# Patient Record
Sex: Male | Born: 1975 | Hispanic: No | Marital: Married | Smoking: Never smoker
Health system: Southern US, Community
[De-identification: ages and names within clinical notes are randomized; demographics above are authoritative.]

## PROBLEM LIST (undated history)

## (undated) DIAGNOSIS — F419 Anxiety disorder, unspecified: Secondary | ICD-10-CM

## (undated) DIAGNOSIS — Z86010 Personal history of colonic polyps: Secondary | ICD-10-CM

## (undated) DIAGNOSIS — L649 Androgenic alopecia, unspecified: Secondary | ICD-10-CM

## (undated) HISTORY — DX: Androgenic alopecia, unspecified: L64.9

## (undated) HISTORY — DX: Personal history of colonic polyps: Z86.010

## (undated) HISTORY — DX: Anxiety disorder, unspecified: F41.9

## (undated) HISTORY — PX: COLONOSCOPY: SHX174

---

## 2009-08-31 HISTORY — PX: COLONOSCOPY: SHX174

## 2010-03-25 ENCOUNTER — Ambulatory Visit: Payer: Self-pay | Admitting: Gastroenterology

## 2010-03-25 DIAGNOSIS — R197 Diarrhea, unspecified: Secondary | ICD-10-CM | POA: Insufficient documentation

## 2010-03-26 LAB — CONVERTED CEMR LAB
Basophils Relative: 0.7 % (ref 0.0–3.0)
CO2: 31 meq/L (ref 19–32)
Calcium: 9.3 mg/dL (ref 8.4–10.5)
Chloride: 102 meq/L (ref 96–112)
Creatinine, Ser: 0.9 mg/dL (ref 0.4–1.5)
Eosinophils Absolute: 0.1 10*3/uL (ref 0.0–0.7)
Eosinophils Relative: 1.4 % (ref 0.0–5.0)
GFR calc non Af Amer: 98.98 mL/min (ref >60)
Glucose, Bld: 99 mg/dL (ref 70–99)
Hemoglobin: 14.6 g/dL (ref 13.0–17.0)
MCHC: 34.2 g/dL (ref 30.0–36.0)
MCV: 85.5 fL (ref 78.0–100.0)
Monocytes Absolute: 0.7 10*3/uL (ref 0.1–1.0)
Neutro Abs: 6 10*3/uL (ref 1.4–7.7)
RBC: 5 M/uL (ref 4.22–5.81)
Sodium: 138 meq/L (ref 135–145)
Total Bilirubin: 0.6 mg/dL (ref 0.3–1.2)
Total Protein: 7.2 g/dL (ref 6.0–8.3)

## 2010-03-27 ENCOUNTER — Encounter (INDEPENDENT_AMBULATORY_CARE_PROVIDER_SITE_OTHER): Payer: Self-pay | Admitting: *Deleted

## 2010-03-27 LAB — CONVERTED CEMR LAB: Tissue Transglutaminase Ab, IgA: 10.9 units (ref <20)

## 2010-03-28 ENCOUNTER — Encounter (INDEPENDENT_AMBULATORY_CARE_PROVIDER_SITE_OTHER): Payer: Self-pay | Admitting: *Deleted

## 2010-03-31 ENCOUNTER — Ambulatory Visit: Payer: Self-pay | Admitting: Gastroenterology

## 2010-04-07 ENCOUNTER — Ambulatory Visit: Payer: Self-pay | Admitting: Gastroenterology

## 2010-04-10 ENCOUNTER — Encounter: Payer: Self-pay | Admitting: Gastroenterology

## 2010-05-15 ENCOUNTER — Ambulatory Visit: Payer: Self-pay | Admitting: Internal Medicine

## 2010-05-15 DIAGNOSIS — B360 Pityriasis versicolor: Secondary | ICD-10-CM | POA: Insufficient documentation

## 2010-05-15 DIAGNOSIS — B356 Tinea cruris: Secondary | ICD-10-CM | POA: Insufficient documentation

## 2010-05-15 LAB — CONVERTED CEMR LAB
Direct LDL: 167.4 mg/dL
HDL: 45.2 mg/dL (ref >39.00)
PSA: 0.97 ng/mL (ref 0.10–4.00)

## 2010-09-30 NOTE — Letter (Signed)
Summary: Lipid Letter  [facility]  [address] [place], [place] [address]   Phone: [phone]  Fax: [phone]    05/15/2010  [name] [name] [address] [place], [place]  [address]  Dear [name]:  We have carefully reviewed your last lipid profile from  and the results are noted below with a summary of recommendations for lipid management.    Cholesterol:       221     Goal: <200   HDL "good" Cholesterol:   40.34     Goal: >40   LDL "bad" Cholesterol:   167     Goal: <130   Triglycerides:       110.0     Goal: <150        TLC Diet (Therapeutic Lifestyle Change): Saturated Fats & Transfatty acids should be kept < 7% of total calories ***Reduce Saturated Fats Polyunstaurated Fat can be up to 10% of total calories Monounsaturated Fat Fat can be up to 20% of total calories Total Fat should be no greater than 25-35% of total calories Carbohydrates should be 50-60% of total calories Protein should be approximately 15% of total calories Fiber should be at least 20-30 grams a day ***Increased fiber may help lower LDL Total Cholesterol should be < 200mg /day Consider adding plant stanol/sterols to diet (example: Benacol spread) ***A higher intake of unsaturated fat may reduce Triglycerides and Increase HDL    Adjunctive Measures (may lower LIPIDS and reduce risk of Heart Attack) include: Aerobic Exercise (20-30 minutes 3-4 times a week) Limit Alcohol Consumption Weight Reduction Aspirin 75-81 mg a day by mouth (if not allergic or contraindicated) Dietary Fiber 20-30 grams a day by mouth     Current Medications: 1)    Propecia 1 Mg Tabs (Finasteride) .... One tablet by mouth once daily 2)    Advil 200 Mg Tabs (Ibuprofen) .... As needed 3)    Excedrin Extra Strength 250-250-65 Mg Tabs (Aspirin-acetaminophen-caffeine) .... As needed 4)    Ketoconazole 200 Mg Tabs (Ketoconazole) .... One by mouth once daily for 3 days 5)    Ketoconazole 2 % Crea (Ketoconazole)  .... Apply to aa two times a day  If you have any questions, please call. We appreciate being able to work with you.   Sincerely,    [facility] Etta Grandchild MD

## 2010-09-30 NOTE — Assessment & Plan Note (Signed)
History of Present Illness Visit Type: new patient  Primary GI MD: Rob Bunting MD Primary Provider: Maricela Bo, MD  Requesting Provider: na Chief Complaint: GERD History of Present Illness:     -year-old man  who has nausea with most meals, also a brisk gastro-colic reflex.  All foods.  His brother was just diagnosed with Celiac Sprue.   will have loose stools at night sometimes. Dairy not an issue.  He tends to have loose stools, for 2-3 months.  A lot of bloating, dyspepsia.  Other brother has crohn's disease.  Pt never sees blood  in stools.  Has gained 20 pounds in past year.           Current Medications (verified): 1)  Propecia 1 Mg Tabs (Finasteride) .... One Tablet By Mouth Once Daily 2)  Advil 200 Mg Tabs (Ibuprofen) .... As Needed 3)  Excedrin Extra Strength [id number] Mg Tabs (Aspirin-Acetaminophen-Caffeine) .... As Needed  Allergies (verified): No Known Drug Allergies  Past History:  Past Medical History: none  Past Surgical History: none  Family History: mother had colon cancer in her 8s, 54s Brother with celiac sprue Other Brother with Crohn's disease  Social History: he is married, he has 2 children, he works as a Nurse, adult, he does not smoke cigarettes or drink much alcohol. He drinks 4 caffeinated beverages a day  Review of Systems       Pertinent positive and negative review of systems were noted in the above HPI and GI specific review of systems.  All other review of systems was otherwise negative.   Vital Signs:  Patient profile:   35 year old male Height:      70 inches Weight:      218 pounds BMI:     31.39 BSA:     2.17 Pulse rate:   76 / minute Pulse rhythm:   regular BP sitting:   132 / 84  (left arm) Cuff size:   regular  Vitals Entered By: Ok Anis CMA (March 25, 2010 3:02 PM)  Physical Exam  Additional Exam:  Constitutional: generally well appearing Psychiatric: alert and oriented times  3 Eyes: extraocular movements intact Mouth: oropharynx moist, no lesions Neck: supple, no lymphadenopathy Cardiovascular: heart regular rate and rythm Lungs: CTA bilaterally Abdomen: soft, non-tender, non-distended, no obvious ascites, no peritoneal signs, normal bowel sounds Extremities: no lower extremity edema bilaterally Skin: no lesions on visible extremities    Impression & Recommendations:  Problem # 1:  loose stools, family history of colon cancer, family history of celiac sprue he has had a change in his bowel habits recently, loosening of his stools. 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 Perhaps inflammatory bowel disease. His mother had colon cancer and so that is also the differential list. He will get a basic set of labs including CBC, complete metabolic profile, celiac sprue testing. He will at least need a colonoscopy and if the above tests are suggestive for celiac sprue that he will also need an upper endoscopy to biopsy his duodenum.  Other Orders: TLB-CBC Platelet - w/Differential (85025-CBCD) TLB-Sedimentation Rate (ESR) (85652-ESR) TLB-IgA (Immunoglobulin A) (82784-IGA) T-Sprue Panel (Celiac Disease Aby Eval) (83516x3/[address]) TLB-CMP (Comprehensive Metabolic Pnl) (80053-COMP)  Patient Instructions: 1)  You will get lab test(s) done today (cbc, esr, total IgA leve, celiac sprue panel, cmet). 2)  You will be scheduled to have a colonoscopy for loose stools, family history of colon cancer. 3)  The medication list was reviewed and reconciled.  All changed / newly prescribed medications were explained.  A complete medication list was provided to the patient / caregiver.

## 2010-09-30 NOTE — Letter (Signed)
Summary: Previsit letter  [facility]  [address] [place], [place] [address]   Phone: [phone]  Fax: [phone]       03/27/2010 MRN: [id number]  [name] [name] [address] [place], [place]  [address]  Dear Mr. Gerre Pebbles,  Welcome to the Gastroenterology Division at [facility].    You are scheduled to see a nurse for your pre-procedure visit on 03/31/10 at 4 pm on the 3rd floor at [facility], [address]  We ask that you try to arrive at our office 15 minutes prior to your appointment time to allow for check-in.  Your nurse visit will consist of discussing your medical and surgical history, your immediate family medical history, and your medications.    Please bring a complete list of all your medications or, if you prefer, bring the medication bottles and we will list them.  We will need to be aware of both prescribed and over the counter drugs.  We will need to know exact dosage information as well.  If you are on blood thinners (Coumadin, Plavix, Aggrenox, Ticlid, etc.) please call our office today/prior to your appointment, as we need to consult with your physician about holding your medication.   Please be prepared to read and sign documents such as consent forms, a financial agreement, and acknowledgement forms.  If necessary, and with your consent, a friend or relative is welcome to sit-in on the nurse visit with you.  Please bring your insurance card so that we may make a copy of it.  If your insurance requires a referral to see a specialist, please bring your referral form from your primary care physician.  No co-pay is required for this nurse visit.     If you cannot keep your appointment, please call [phone] to cancel or reschedule prior to your appointment date.  This allows [place] the opportunity to schedule an appointment for another patient in need of care.    Thank you for choosing [facility] for your medical needs.   We appreciate the opportunity to care for you.  Please visit [place] at our website  to learn more about our practice.                     Sincerely.                                                                                                                   The Gastroenterology Division  Appended Document: Previsit letter mailed

## 2010-09-30 NOTE — Letter (Signed)
Summary: [facility] Instructions  [facility]  [address] [place], [place] [address]   Phone: [phone]  Fax: [phone]       [name] [name]    November 29, 1975    MRN: [id number]        Procedure Day Dorna Bloom:  Duanne Limerick  04/07/10     Arrival Time:  2:30pm     Procedure Time: 3:30pm     Location of Procedure:                    _X _   [facility]oscopy Center (4th Floor)                       PREPARATION FOR COLONOSCOPY WITH MOVIPREP   Starting 5 days prior to your procedure  [facility] 08/03  do not eat nuts, seeds, popcorn, corn, beans, peas,  salads, or any raw vegetables.  Do not take any fiber supplements (e.g. Metamucil, Citrucel, and Benefiber).  THE DAY BEFORE YOUR PROCEDURE         DATE:  SUNDAY  08/07  1.  Drink clear liquids the entire day-NO SOLID FOOD  2.  Do not drink anything colored red or purple.  Avoid juices with pulp.  No orange juice.  3.  Drink at least 64 oz. (8 glasses) of fluid/clear liquids during the day to prevent dehydration and help the prep work efficiently.  CLEAR LIQUIDS INCLUDE: Water Jello Ice Popsicles Tea (sugar ok, no milk/cream) Powdered fruit flavored drinks Coffee (sugar ok, no milk/cream) Gatorade Juice: apple, white grape, white cranberry  Lemonade Clear bullion, consomm, broth Carbonated beverages (any kind) Strained chicken noodle soup Hard Candy                             4.  In the morning, mix first dose of MoviPrep solution:    Empty 1 Pouch A and 1 Pouch B into the disposable container    Add lukewarm drinking water to the top line of the container. Mix to dissolve    Refrigerate (mixed solution should be used within 24 hrs)  5.  Begin drinking the prep at 5:00 p.m. The MoviPrep container is divided by 4 marks.   Every 15 minutes drink the solution down to the next mark (approximately 8 oz) until the full liter is complete.   6.  Follow completed prep with 16 oz of clear liquid of your choice (Nothing  red or purple).  Continue to drink clear liquids until bedtime.  7.  Before going to bed, mix second dose of MoviPrep solution:    Empty 1 Pouch A and 1 Pouch B into the disposable container    Add lukewarm drinking water to the top line of the container. Mix to dissolve    Refrigerate  THE DAY OF YOUR PROCEDURE      DATE:  MONDAY  08/08  Beginning at  10:30 a.m (5 hours before procedure):         1. Every 15 minutes, drink the solution down to the next mark (approx 8 oz) until the full liter is complete.  2. Follow completed prep with 16 oz. of clear liquid of your choice.    3. You may drink clear liquids until  1:30pm  (2 HOURS BEFORE PROCEDURE).   MEDICATION INSTRUCTIONS  Unless otherwise instructed, you should take regular prescription medications with a small sip of water   as  early as possible the morning of your procedure.         OTHER INSTRUCTIONS  You will need a responsible adult at least 37 years of age to accompany you and drive you home.   This person must remain in the waiting room during your procedure.  Wear loose fitting clothing that is easily removed.  Leave jewelry and other valuables at home.  However, you may wish to bring a book to read or  an iPod/MP3 player to listen to music as you wait for your procedure to start.  Remove all body piercing jewelry and leave at home.  Total time from sign-in until discharge is approximately 2-3 hours.  You should go home directly after your procedure and rest.  You can resume normal activities the  day after your procedure.  The day of your procedure you should not:   Drive   Make legal decisions   Operate machinery   Drink alcohol   Return to work  You will receive specific instructions about eating, activities and medications before you leave.    The above instructions have been reviewed and explained to me by   Ezra Sites RN  March 31, 2010 4:05 PM     I fully understand and can  verbalize these instructions _____________________________ Date _________

## 2010-09-30 NOTE — Miscellaneous (Signed)
Summary: [facility]  Clinical Lists Changes  Medications: Added new medication of MOVIPREP 100 GM  SOLR (PEG-KCL-NACL-NASULF-NA ASC-C) As per prep instructions. - Signed Rx of MOVIPREP 100 GM  SOLR (PEG-KCL-NACL-NASULF-NA ASC-C) As per prep instructions.;  #1 x 0;  Signed;  Entered by: Ezra Sites RN;  Authorized by: Rachael Fee MD;  Method used: Electronically to [facility], [place], [place], [place]  [address], Ph: [phone], Fax: [phone] Observations: Added new observation of NKA: T (03/31/2010 15:46)    Prescriptions: MOVIPREP 100 GM  SOLR (PEG-KCL-NACL-NASULF-NA ASC-C) As per prep instructions.  #1 x 0   Entered by:   Ezra Sites RN   Authorized by:   Rachael Fee MD   Signed by:   Ezra Sites RN on 03/31/2010   Method used:   Electronically to        [facility] (retail)       [address]       [place], [place]  [address]       Ph: [phone]       Fax: [phone]   RxID:   [id number]

## 2010-09-30 NOTE — Procedures (Signed)
Summary: Colonoscopy  Patient: [name] [name] Note: All result statuses are Final unless otherwise noted.  Tests: (1) Colonoscopy (COL)   COL Colonoscopy           DONE     [facility]     [address]     [place], [place]  [address]           COLONOSCOPY PROCEDURE REPORT           PATIENT:  [name] [name], [name]  MR#:  [id number]     BIRTHDATE:  [birth year], 33 yrs. old  GENDER:  male     ENDOSCOPIST:  Rachael Fee, MD     PROCEDURE DATE:  04/07/2010     PROCEDURE:  Colonoscopy with biopsy and snare polypectomy     ASA CLASS:  Class I     INDICATIONS:  dyspepsia, loose stools; family history of Crohn's,     Celiac Sprue and colon cancer (mother in late 66s)     MEDICATIONS:   Fentanyl 50 mcg IV, Versed 6 mg IV           DESCRIPTION OF PROCEDURE:   After the risks benefits and     alternatives of the procedure were thoroughly explained, informed     consent was obtained.  Digital rectal exam was performed and     revealed no rectal masses.   The LB [id number] endoscope     was introduced through the anus and advanced to the terminal ileum     which was intubated for a short distance, without limitations.     The quality of the prep was good, using MoviPrep.  The instrument     was then slowly withdrawn as the colon was fully examined.     <<PROCEDUREIMAGES>>           FINDINGS:  The terminal ileum appeared normal (see image2).  A     sessile polyp was found in the mid transverse colon. This was 3mm     across, removed with cold snare and sent to pathology (jar 2) (see     image3).  This was otherwise a normal examination of the colon.     Random biopsies were taken from colon and sent to pathology (jar     1) (see image1 and image5).   Retroflexed views in the rectum     revealed no abnormalities.    The scope was then withdrawn from     the patient and the procedure completed.           COMPLICATIONS:  None     ENDOSCOPIC IMPRESSION:     1) Normal terminal  ileum; no sign of Crohn's disease     2) Sessile polyp in the mid transverse colon, removed and sent     to pathology     3) Otherwise normal examination; colon was randomly biopsied to     check for microsopic colitis           RECOMMENDATIONS:     1) Given your significant family history of colon cancer, you     should have a repeat colonoscopy in 5 years     2) You will receive a letter within 1-2 weeks with the results     of your biopsy as well as final recommendations. Please call my     office if you have not received a letter after 3 weeks.  ______________________________     Rachael Fee, MD           n.     eSIGNED:   Rachael Fee at 04/07/2010 03:30 PM           [place], [name], [id number]  Note: An exclamation mark (!) indicates a result that was not dispersed into the flowsheet. Document Creation Date: 04/07/2010 3:32 PM _______________________________________________________________________  (1) Order result status: Final Collection or observation date-time: 04/07/2010 15:25 Requested date-time:  Receipt date-time:  Reported date-time:  Referring Physician:   Ordering Physician: Rob Bunting [phone]) Specimen Source:  Source: Launa Grill Order Number: [id number] Lab site:   Appended Document: Colonoscopy     Procedures Next Due Date:    Colonoscopy: 04/2015

## 2010-09-30 NOTE — Assessment & Plan Note (Signed)
Summary: New / [employer] / # / CD   Vital Signs:  Patient profile:   35 year old male Height:      70 inches Weight:      210.50 pounds BMI:     30.31 O2 Sat:      96 % on Room air Temp:     98.2 degrees F oral Pulse rate:   84 / minute Pulse rhythm:   regular Resp:     16 per minute BP sitting:   120 / 84  (left arm) Cuff size:   large  Vitals Entered By: Rock Nephew CMA (May 15, 2010 9:06 AM)  Nutrition Counseling: Patient's BMI is greater than 25 and therefore counseled on weight management options.  O2 Flow:  Room air CC: New pt CPX w/labs, Rash Is Patient Diabetic? No Pain Assessment Patient in pain? no       Does patient need assistance? Functional Status Self care Ambulation Normal   Primary Care Provider:  Etta Grandchild MD  CC:  New pt CPX w/labs and Rash.  History of Present Illness:  Rash      This is a 35 year old man who presents with Rash.  The symptoms began 6-12 months ago.  The severity is described as mild.  The patient reports macules, itching, and scaling, but denies papules, nodules, hives, welts, pustules, blisters, ulcers, weeping, oozing, redness, increased warmth, and tenderness.  The rash is located on the face/trunk/limbs diffusely.  The patient denies the following symptoms: fever, headache, facial swelling, tongue swelling, burning, difficulty breathing, abdominal pain, nausea, vomiting, diarrhea, dizziness, sore throat, dysuria, eye symptoms, and arthralgias.    He also wants a complete physical today.  Preventive Screening-Counseling & Management  Alcohol-Tobacco     Alcohol drinks/day: 0     Smoking Status: never  Caffeine-Diet-Exercise     Caffeine use/day: Yes     Does Patient Exercise: yes  Safety-Violence-Falls     Seat Belt Use: yes     Helmet Use: yes     Firearms in the Home: no firearms in the home     Smoke Detectors: no     Violence in the Home: no risk noted  Current Medications (verified): 1)  Propecia 1  Mg Tabs (Finasteride) .... One Tablet By Mouth Once Daily 2)  Advil 200 Mg Tabs (Ibuprofen) .... As Needed 3)  Excedrin Extra Strength [phone] Mg Tabs (Aspirin-Acetaminophen-Caffeine) .... As Needed  Allergies (verified): No Known Drug Allergies  Past History:  Past Surgical History: Last updated: 03/25/2010 none  Family History: Last updated: 05/15/2010 mother had colon cancer in her 17s, 96s Brother with celiac sprue Other Brother with Crohn's disease Family History Breast cancer 1st degree relative <50 Family History of Prostate CA 1st degree relative <50  Social History: Last updated: 03/25/2010 he is married, he has 2 children, he works as a Nurse, adult, he does not smoke cigarettes or drink much alcohol. 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 mother had colon cancer in her 14s, 50s Brother with celiac sprue Other Brother with Crohn's disease Family History Breast cancer 1st degree relative <50 Family History of Prostate CA 1st degree relative <50  Social History: Reviewed history from 03/25/2010 and no changes required. he is  married, he has 2 children, he works as a Nurse, adult, he does not smoke cigarettes or drink much alcohol. He drinks 4 caffeinated beverages a daySmoking Status:  never Does Patient Exercise:  yes Caffeine use/day:  Yes Seat Belt Use:  yes  Review of Systems       The patient complains of weight gain.  The patient denies anorexia, fever, weight loss, chest pain, syncope, dyspnea on exertion, peripheral edema, prolonged cough, headaches, hemoptysis, abdominal pain, melena, hematochezia, severe indigestion/heartburn, hematuria, difficulty  walking, depression, enlarged lymph nodes, angioedema, and testicular masses.    Physical Exam  General:  alert, well-developed, well-nourished, well-hydrated, appropriate dress, normal appearance, healthy-appearing, cooperative to examination, good hygiene, and overweight-appearing.   Head:  normocephalic, atraumatic, no abnormalities observed, and no abnormalities palpated.   Eyes:  vision grossly intact, pupils equal, pupils round, and pupils reactive to light.   Mouth:  Oral mucosa and oropharynx without lesions or exudates.  Teeth in good repair. Neck:  supple, full ROM, and no masses.   Lungs:  normal respiratory effort, no intercostal retractions, no accessory muscle use, normal breath sounds, no dullness, no fremitus, no crackles, and no wheezes.   Heart:  normal rate, regular rhythm, no murmur, no gallop, no rub, and no JVD.   Abdomen:  soft, non-tender, normal bowel sounds, no distention, no masses, no guarding, no rigidity, no rebound tenderness, no abdominal hernia, no inguinal hernia, no hepatomegaly, and no splenomegaly.   Genitalia:  uncircumcised, no hydrocele, no varicocele, no scrotal masses, no testicular masses or atrophy, no cutaneous lesions, and no urethral discharge.   Msk:  normal ROM, no joint tenderness, no joint swelling, no joint warmth, no redness over joints, no joint deformities, no joint instability, and no crepitation.   Pulses:  R and L carotid,radial,femoral,dorsalis pedis and posterior tibial pulses are full and equal bilaterally Extremities:  No clubbing, cyanosis, edema, or deformity noted with normal full range of motion of all joints.   Neurologic:  No cranial nerve deficits noted. Station and gait are normal. Plantar reflexes are down-going bilaterally. DTRs are symmetrical throughout. Sensory, motor and coordinative functions appear intact. Skin:  over his torso and arms there are diffuse hypopigmented macules with faint scale with coalescence  over the  torso but with round lesions over the arms and torso. In the intertriginous areas of the groin there is bilateral erythema and scaling. tattoo(s), excessive tan, and solar damage. he has two fibromas but no abnormal pigmented lesions. Cervical Nodes:  no anterior cervical adenopathy and no posterior cervical adenopathy.   Axillary Nodes:  no R axillary adenopathy and no L axillary adenopathy.   Inguinal Nodes:  no R inguinal adenopathy and no L inguinal adenopathy.   Psych:  Cognition and judgment appear intact. Alert and cooperative with normal attention span and concentration. No apparent delusions, illusions, hallucinations   Impression & Recommendations:  Problem # 1:  TINEA VERSICOLOR (ICD-111.0) Assessment New  His updated medication list for this problem includes:    Ketoconazole 200 Mg Tabs (Ketoconazole) ..... One by mouth once daily for 3 days    Ketoconazole 2 % Crea (Ketoconazole) .[facility]... Apply to aa two times a day  Problem # 2:  TINEA CRURIS (ICD-110.3) Assessment: New  Problem # 3:  ROUTINE GENERAL MEDICAL EXAM@[email or web address]  CARE FACL (ICD-V70.0) Assessment: New  Orders: Venipuncture (04540) TLB-Lipid Panel (80061-LIPID) TLB-PSA (Prostate Specific Antigen) (84153-PSA)  Colonoscopy: DONE (04/07/2010) Next Colonoscopy due:: 04/2015 (04/07/2010)  Discussed using sunscreen, use of alcohol,  drug use, self testicular exam, routine dental care, routine eye care, routine physical exam, seat belts, multiple vitamins,  and recommendations for immunizations.  Discussed exercise and checking cholesterol. Also recommend checking PSA.  Complete Medication List: 1)  Propecia 1 Mg Tabs (Finasteride) .... One tablet by mouth once daily 2)  Advil 200 Mg Tabs (Ibuprofen) .... As needed 3)  Excedrin Extra Strength 250-250-65 Mg Tabs (Aspirin-acetaminophen-caffeine) .... As needed 4)  Ketoconazole 200 Mg Tabs (Ketoconazole) .... One by mouth once daily for 3 days 5)  Ketoconazole 2 % Crea  (Ketoconazole) .... Apply to aa two times a day  Patient Instructions: 1)  Please schedule a follow-up appointment in 2 months. 2)  It is important that you exercise regularly at least 20 minutes 5 times a week. If you develop chest pain, have severe difficulty breathing, or feel very tired , stop exercising immediately and seek medical attention. 3)  You need to lose weight. Consider a lower calorie diet and regular exercise.  Prescriptions: KETOCONAZOLE 2 % CREA (KETOCONAZOLE) Apply to AA two times a day  #60 gms x 2   Entered and Authorized by:   Etta Grandchild MD   Signed by:   Etta Grandchild MD on 05/15/2010   Method used:   Electronically to        [facility]       [address]       [place], [place]  [address]       Ph: [phone]       Fax: [phone]   RxID:   [id number] KETOCONAZOLE 200 MG TABS (KETOCONAZOLE) One by mouth once daily for 3 days  #3 x 1   Entered and Authorized by:   Etta Grandchild MD   Signed by:   Etta Grandchild MD on 05/15/2010   Method used:   Electronically to        [facility])       [address]       [place], [place]  [address]       Ph: [phone]       Fax: [phone]   RxID:   [id number] PROPECIA 1 MG TABS (FINASTERIDE) one tablet by mouth once daily  #30 x 11   Entered and Authorized by:   Etta Grandchild MD   Signed by:   Etta Grandchild MD on 05/15/2010   Method used:   Electronically to        [facility] ([facility])       [address]       [place], [place]  [address]       Ph: [phone]       Fax: [phone]   RxID:   [id number]

## 2010-09-30 NOTE — Letter (Signed)
Summary: Results Letter  [facility]  [address] [place], [place] [address]   Phone: [phone]  Fax: [phone]        April 10, 2010 MRN: [id number]    [name] [address] [place], [place]  [address]    Dear [name],   At least one of the polyps removed during your recent procedure was proven to be adenomatous.  These are pre-cancerous polyps that may have grown into cancers if they had not been removed.  Based on current nationally recognized surveillance guidelines, I recommend that you have a repeat colonoscopy in 5 years.  The random colon biopsies I took to check for microscopic colitis were all normal.  We will therefore put your information in our reminder system and will contact you in 5 years to schedule a repeat procedure.  Please call if you have any questions or concerns.       Sincerely,  Rachael Fee MD  This letter has been electronically signed by your physician.  Appended Document: Results Letter letter mailed

## 2010-12-04 ENCOUNTER — Ambulatory Visit (INDEPENDENT_AMBULATORY_CARE_PROVIDER_SITE_OTHER): Payer: BC Managed Care – PPO | Admitting: Internal Medicine

## 2010-12-04 ENCOUNTER — Encounter: Payer: Self-pay | Admitting: Internal Medicine

## 2010-12-04 VITALS — BP 122/82 | HR 78 | Temp 98.5°F | Ht 70.0 in | Wt 206.0 lb

## 2010-12-04 DIAGNOSIS — F411 Generalized anxiety disorder: Secondary | ICD-10-CM

## 2010-12-04 MED ORDER — SERTRALINE HCL 50 MG PO TABS: 50.0000 mg | ORAL_TABLET | Freq: Every day | ORAL | Status: DC

## 2010-12-04 MED ORDER — CLONAZEPAM 1 MG PO TABS: 1.0000 mg | ORAL_TABLET | Freq: Three times a day (TID) | ORAL | Status: DC

## 2010-12-04 NOTE — Assessment & Plan Note (Signed)
Start psychotherapy with Vernell Leep, start treatment with sertraline and klonopin

## 2010-12-04 NOTE — Patient Instructions (Signed)
Anxiety and Panic Attacks Your caregiver has informed you that you are having an anxiety or panic attack. There may be many forms of this. Most of the time these attacks come suddenly and without warning. They come at any time of day, including periods of sleep, and at any time of life. They may be strong and unexplained. Although panic attacks are very scary, they are physically harmless. Sometimes the cause of your anxiety is not known. Anxiety is a protective mechanism of the body in its fight or flight mechanism. Most of these perceived danger situations are actually nonphysical situations (such as anxiety over losing a job). CAUSES The causes of an anxiety or panic attack are many. Panic attacks may occur in otherwise healthy people given a certain set of circumstances. There may be a genetic cause for panic attacks. Some medications may also have anxiety as a side effect. SYMPTOMS Some of the most common feelings are:  Intense terror.  Dizziness, feeling faint.   Hot and cold flashes.   Fear of going crazy.   Feelings that nothing is real.   Sweating.   Shaking.   Chest pain or a fast heartbeat (palpitations).  Smothering, choking sensations.   Feelings of impending doom and that death is near.   Tingling of extremities, this may be from over breathing.   Altered reality (derealization).   Being detached from yourself (depersonalization).   Several symptoms can be present to make up anxiety or panic attacks. DIAGNOSIS The evaluation by your caregiver will depend on the type of symptoms you are experiencing. The diagnosis of anxiety or pain attack is made when no physical illness can be determined to be a cause of the symptoms. TREATMENT Treatment to prevent anxiety and panic attacks may include:  Avoidance of circumstances that cause anxiety.   Reassurance and relaxation.   Regular exercise.   Relaxation therapies, such as yoga.   Psychotherapy with a psychiatrist  or therapist.   Avoidance of caffeine, alcohol and illegal drugs.   Prescribed medication.  SEEK IMMEDIATE MEDICAL CARE IF:  You experience panic attack symptoms that are different than your usual symptoms.   You have any worsening or concerning symptoms.  Document Released: 08/17/2005 Document Re-Released: 02/04/2010 Exit[employer] Patient Information 2011 [employer]. 

## 2010-12-04 NOTE — Progress Notes (Signed)
Subjective:    Patient ID: [name] [name], male    DOB: 1976-08-05, 35 y.o.   MRN: [id number]  Anxiety Presents for initial visit. Onset was 1 to 6 months ago. The problem has been gradually worsening. Symptoms include depressed mood, excessive worry, hyperventilation, insomnia, irritability, muscle tension, nervous/anxious behavior, panic and restlessness. Patient reports no chest pain, compulsions, confusion, decreased concentration, dizziness, dry mouth, feeling of choking, impotence, malaise, nausea, obsessions, palpitations, shortness of breath or suicidal ideas. Symptoms occur most days. The severity of symptoms is moderate. The symptoms are aggravated by family issues and work stress. The quality of sleep is good. Nighttime awakenings: occasional.   Risk factors include prior traumatic experience. His past medical history is significant for anxiety/panic attacks. There is no history of asthma, bipolar disorder, depression, fibromyalgia or suicide attempts. Past treatments include lifestyle changes.      Review of Systems  Constitutional: Positive for irritability. Negative for fever, chills, diaphoresis, activity change, appetite change, fatigue and unexpected weight change.  Respiratory: Negative for apnea, cough, choking, chest tightness, shortness of breath, wheezing and stridor.   Cardiovascular: Negative for chest pain, palpitations and leg swelling.  Gastrointestinal: Negative for nausea, vomiting, abdominal pain, diarrhea, constipation, blood in stool, abdominal distention, anal bleeding and rectal pain.  Genitourinary: Negative for impotence.  Musculoskeletal: Negative for myalgias, back pain, joint swelling, arthralgias and gait problem.  Skin: Negative for color change, pallor and rash.  Neurological: Negative for dizziness, tremors, seizures, syncope, speech difficulty, weakness, numbness and headaches.  Hematological: Negative for adenopathy. Does not bruise/bleed easily.    Psychiatric/Behavioral: Positive for dysphoric mood. Negative for suicidal ideas, hallucinations, behavioral problems, confusion, sleep disturbance, self-injury, decreased concentration and agitation. The patient is nervous/anxious and has insomnia. The patient is not hyperactive.        Objective:   Physical Exam  Constitutional: He is oriented to person, place, and time. He appears well-developed and well-nourished. No distress.  HENT:  Head: Normocephalic and atraumatic.  Right Ear: External ear normal.  Left Ear: External ear normal.  Nose: Nose normal.  Mouth/Throat: Oropharynx is clear and moist. No oropharyngeal exudate.  Eyes: Conjunctivae and EOM are normal. Pupils are equal, round, and reactive to light. Right eye exhibits no discharge. Left eye exhibits no discharge. No scleral icterus.  Neck: Normal range of motion. Neck supple. No thyromegaly present.  Cardiovascular: Normal rate, regular rhythm, normal heart sounds and intact distal pulses.  Exam reveals no gallop and no friction rub.   No murmur heard. Pulmonary/Chest: Effort normal and breath sounds normal. No respiratory distress. He has no wheezes. He has no rales. He exhibits no tenderness.  Abdominal: Soft. Bowel sounds are normal. He exhibits no distension and no mass. There is no tenderness. There is no rebound and no guarding.  Musculoskeletal: Normal range of motion. He exhibits no edema and no tenderness.  Lymphadenopathy:    He has no cervical adenopathy.  Neurological: He is alert and oriented to person, place, and time. He has normal reflexes.  Skin: Skin is warm. No rash noted. He is not diaphoretic. No erythema. No pallor.  Psychiatric: His mood appears anxious (mildly). His affect is not angry, not blunt, not labile and not inappropriate. His speech is not delayed, not tangential and not slurred. He is not agitated, not aggressive, is not hyperactive and not combative. Thought content is not paranoid and not  delusional. Cognition and memory are not impaired. He does not express impulsivity or inappropriate judgment. He does not  exhibit a depressed mood. He expresses no homicidal and no suicidal ideation. He expresses no suicidal plans and no homicidal plans. He is communicative. He exhibits normal recent memory and normal remote memory.          Assessment & Plan:

## 2010-12-08 ENCOUNTER — Ambulatory Visit: Payer: Self-pay | Admitting: Internal Medicine

## 2011-01-20 ENCOUNTER — Telehealth: Payer: Self-pay | Admitting: *Deleted

## 2011-01-20 DIAGNOSIS — F411 Generalized anxiety disorder: Secondary | ICD-10-CM

## 2011-01-20 MED ORDER — CLONAZEPAM 1 MG PO TABS: 1.0000 mg | ORAL_TABLET | Freq: Three times a day (TID) | ORAL | Status: DC

## 2011-01-20 NOTE — Telephone Encounter (Signed)
ok 

## 2011-01-20 NOTE — Telephone Encounter (Signed)
Left vm for pt check w/pharmacy

## 2011-01-20 NOTE — Telephone Encounter (Signed)
Patient requesting RF of Klonopin. He was rescheduled from this week. Next OV 02/05/11

## 2011-01-21 ENCOUNTER — Ambulatory Visit: Payer: BC Managed Care – PPO | Admitting: Internal Medicine

## 2011-02-05 ENCOUNTER — Ambulatory Visit (INDEPENDENT_AMBULATORY_CARE_PROVIDER_SITE_OTHER): Payer: BC Managed Care – PPO | Admitting: Internal Medicine

## 2011-02-05 ENCOUNTER — Encounter: Payer: Self-pay | Admitting: Internal Medicine

## 2011-02-05 VITALS — BP 126/82 | HR 86 | Temp 97.6°F | Resp 16 | Wt 210.0 lb

## 2011-02-05 DIAGNOSIS — Z23 Encounter for immunization: Secondary | ICD-10-CM

## 2011-02-05 DIAGNOSIS — F411 Generalized anxiety disorder: Secondary | ICD-10-CM

## 2011-02-05 MED ORDER — CLONAZEPAM 1 MG PO TABS: 1.0000 mg | ORAL_TABLET | Freq: Two times a day (BID) | ORAL | Status: DC | PRN

## 2011-02-05 NOTE — Patient Instructions (Signed)
Anxiety and Panic Attacks Your caregiver has informed you that you are having an anxiety or panic attack. There may be many forms of this. Most of the time these attacks come suddenly and without warning. They come at any time of day, including periods of sleep, and at any time of life. They may be strong and unexplained. Although panic attacks are very scary, they are physically harmless. Sometimes the cause of your anxiety is not known. Anxiety is a protective mechanism of the body in its fight or flight mechanism. Most of these perceived danger situations are actually nonphysical situations (such as anxiety over losing a job). CAUSES The causes of an anxiety or panic attack are many. Panic attacks may occur in otherwise healthy people given a certain set of circumstances. There may be a genetic cause for panic attacks. Some medications may also have anxiety as a side effect. SYMPTOMS Some of the most common feelings are:  Intense terror.  Dizziness, feeling faint.   Hot and cold flashes.   Fear of going crazy.   Feelings that nothing is real.   Sweating.   Shaking.   Chest pain or a fast heartbeat (palpitations).  Smothering, choking sensations.   Feelings of impending doom and that death is near.   Tingling of extremities, this may be from over breathing.   Altered reality (derealization).   Being detached from yourself (depersonalization).   Several symptoms can be present to make up anxiety or panic attacks. DIAGNOSIS The evaluation by your caregiver will depend on the type of symptoms you are experiencing. The diagnosis of anxiety or pain attack is made when no physical illness can be determined to be a cause of the symptoms. TREATMENT Treatment to prevent anxiety and panic attacks may include:  Avoidance of circumstances that cause anxiety.   Reassurance and relaxation.   Regular exercise.   Relaxation therapies, such as yoga.   Psychotherapy with a psychiatrist  or therapist.   Avoidance of caffeine, alcohol and illegal drugs.   Prescribed medication.  SEEK IMMEDIATE MEDICAL CARE IF:  You experience panic attack symptoms that are different than your usual symptoms.   You have any worsening or concerning symptoms.  Document Released: 08/17/2005 Document Re-Released: 02/04/2010 Exit[employer] Patient Information 2011 [employer]. 

## 2011-02-05 NOTE — Progress Notes (Signed)
  Subjective:    Patient ID: [name] [name], male    DOB: 21-Dec-1975, 35 y.o.   MRN: [id number]  Anxiety Presents for follow-up visit. Symptoms include excessive worry, insomnia, nervous/anxious behavior and panic. Patient reports no chest pain, compulsions, confusion, decreased concentration, depressed mood, dizziness, dry mouth, feeling of choking, hyperventilation, impotence, irritability, malaise, muscle tension, nausea, obsessions, palpitations, restlessness, shortness of breath or suicidal ideas. Symptoms occur most days. The severity of symptoms is mild. The patient sleeps 7 hours per night. The quality of sleep is good. Nighttime awakenings: occasional.   Compliance with medications is 51-75%. Side effects of treatment include auditory problems.      Review of Systems  Constitutional: Negative.  Negative for irritability.  Respiratory: Negative.  Negative for shortness of breath.   Cardiovascular: Negative.  Negative for chest pain and palpitations.  Gastrointestinal: Negative for nausea.  Genitourinary: Negative for impotence.  Musculoskeletal: Negative.   Skin: Negative.   Neurological: Negative for dizziness.  Hematological: Negative.   Psychiatric/Behavioral: Negative for suicidal ideas, hallucinations, behavioral problems, confusion, sleep disturbance, self-injury, dysphoric mood, decreased concentration and agitation. The patient is nervous/anxious and has insomnia. The patient is not hyperactive.        Objective:   Physical Exam  [vitalsreviewed. Constitutional: He is oriented to person, place, and time. He appears well-developed and well-nourished. No distress.  HENT:  Head: Normocephalic and atraumatic.  Right Ear: External ear normal.  Left Ear: External ear normal.  Nose: Nose normal.  Mouth/Throat: Oropharynx is clear and moist. No oropharyngeal exudate.  Eyes: Conjunctivae and EOM are normal. Pupils are equal, round, and reactive to light. Right eye exhibits no  discharge. Left eye exhibits no discharge. No scleral icterus.  Neck: Normal range of motion. Neck supple. No JVD present. No tracheal deviation present. No thyromegaly present.  Cardiovascular: Normal rate, regular rhythm, normal heart sounds and intact distal pulses.  Exam reveals no gallop and no friction rub.   No murmur heard. Pulmonary/Chest: Effort normal and breath sounds normal. No stridor. No respiratory distress. He has no wheezes. He has no rales. He exhibits no tenderness.  Abdominal: Soft. Bowel sounds are normal. He exhibits no distension and no mass. There is no tenderness. There is no rebound and no guarding.  Musculoskeletal: Normal range of motion. He exhibits no edema and no tenderness.  Lymphadenopathy:    He has no cervical adenopathy.  Neurological: He is alert and oriented to person, place, and time. He has normal reflexes. No cranial nerve deficit.  Skin: Skin is warm and dry. No rash noted. He is not diaphoretic. No erythema. No pallor.  Psychiatric: He has a normal mood and affect. His behavior is normal. Judgment and thought content normal.          Assessment & Plan:

## 2011-02-05 NOTE — Assessment & Plan Note (Signed)
Continue current meds and psychotherapy with Vernell Leep

## 2011-05-12 ENCOUNTER — Other Ambulatory Visit: Payer: Self-pay | Admitting: Internal Medicine

## 2011-09-03 ENCOUNTER — Other Ambulatory Visit (INDEPENDENT_AMBULATORY_CARE_PROVIDER_SITE_OTHER): Payer: BC Managed Care – PPO

## 2011-09-03 ENCOUNTER — Ambulatory Visit (INDEPENDENT_AMBULATORY_CARE_PROVIDER_SITE_OTHER): Payer: BC Managed Care – PPO | Admitting: Internal Medicine

## 2011-09-03 ENCOUNTER — Encounter: Payer: Self-pay | Admitting: Internal Medicine

## 2011-09-03 ENCOUNTER — Other Ambulatory Visit: Payer: Self-pay | Admitting: Internal Medicine

## 2011-09-03 VITALS — BP 130/88 | HR 84 | Temp 97.7°F | Resp 16 | Wt 206.0 lb

## 2011-09-03 DIAGNOSIS — Z23 Encounter for immunization: Secondary | ICD-10-CM | POA: Insufficient documentation

## 2011-09-03 DIAGNOSIS — Z0000 Encounter for general adult medical examination without abnormal findings: Secondary | ICD-10-CM | POA: Insufficient documentation

## 2011-09-03 DIAGNOSIS — F411 Generalized anxiety disorder: Secondary | ICD-10-CM

## 2011-09-03 DIAGNOSIS — I10 Essential (primary) hypertension: Secondary | ICD-10-CM | POA: Insufficient documentation

## 2011-09-03 LAB — COMPREHENSIVE METABOLIC PANEL
ALT: 32 U/L (ref 0–53)
AST: 19 U/L (ref 0–37)
Albumin: 4.4 g/dL (ref 3.5–5.2)
Alkaline Phosphatase: 59 U/L (ref 39–117)
Calcium: 9.3 mg/dL (ref 8.4–10.5)
Chloride: 103 mEq/L (ref 96–112)
Potassium: 4.3 mEq/L (ref 3.5–5.1)

## 2011-09-03 LAB — CBC WITH DIFFERENTIAL/PLATELET
Basophils Absolute: 0.1 10*3/uL (ref 0.0–0.1)
Eosinophils Absolute: 0.2 10*3/uL (ref 0.0–0.7)
Lymphocytes Relative: 31.1 % (ref 12.0–46.0)
MCHC: 33.3 g/dL (ref 30.0–36.0)
Monocytes Absolute: 0.6 10*3/uL (ref 0.1–1.0)
Neutrophils Relative %: 58.9 % (ref 43.0–77.0)
Platelets: 376 10*3/uL (ref 150.0–400.0)
RDW: 13.9 % (ref 11.5–14.6)

## 2011-09-03 LAB — URINALYSIS, ROUTINE W REFLEX MICROSCOPIC
Bilirubin Urine: NEGATIVE
Total Protein, Urine: NEGATIVE
Urine Glucose: NEGATIVE
pH: 6 (ref 5.0–8.0)

## 2011-09-03 LAB — LIPID PANEL
Cholesterol: 238 mg/dL — ABNORMAL HIGH (ref 0–200)
HDL: 43.6 mg/dL (ref >39.00)
Triglycerides: 114 mg/dL (ref 0.0–149.0)
VLDL: 22.8 mg/dL (ref 0.0–40.0)

## 2011-09-03 LAB — LDL CHOLESTEROL, DIRECT: Direct LDL: 172.4 mg/dL

## 2011-09-03 MED ORDER — PROPRANOLOL HCL 20 MG PO TABS: 20.0000 mg | ORAL_TABLET | Freq: Two times a day (BID) | ORAL | Status: DC

## 2011-09-03 NOTE — Patient Instructions (Signed)
Health Maintenance, Males A healthy lifestyle and preventative care can promote health and wellness.  Maintain regular health, dental, and eye exams.   Eat a healthy diet. Foods like vegetables, fruits, whole grains, low-fat dairy products, and lean protein foods contain the nutrients you need without too many calories. Decrease your intake of foods high in solid fats, added sugars, and salt. Get information about a proper diet from your caregiver, if necessary.   Regular physical exercise is one of the most important things you can do for your health. Most adults should get at least 150 minutes of moderate-intensity exercise (any activity that increases your heart rate and causes you to sweat) each week. In addition, most adults need muscle-strengthening exercises on 2 or more days a week.    Maintain a healthy weight. The body mass index (BMI) is a screening tool to identify possible weight problems. It provides an estimate of body fat based on height and weight. Your caregiver can help determine your BMI, and can help you achieve or maintain a healthy weight. For adults 20 years and older:   A BMI below 18.5 is considered underweight.   A BMI of 18.5 to 24.9 is normal.   A BMI of 25 to 29.9 is considered overweight.   A BMI of 30 and above is considered obese.   Maintain normal blood lipids and cholesterol by exercising and minimizing your intake of saturated fat. Eat a balanced diet with plenty of fruits and vegetables. Blood tests for lipids and cholesterol should begin at age 20 and be repeated every 5 years. If your lipid or cholesterol levels are high, you are over 50, or you are a high risk for heart disease, you may need your cholesterol levels checked more frequently.Ongoing high lipid and cholesterol levels should be treated with medicines, if diet and exercise are not effective.   If you smoke, find out from your caregiver how to quit. If you do not use tobacco, do not start.    If you choose to drink alcohol, do not exceed 2 drinks per day. One drink is considered to be 12 ounces (355 mL) of beer, 5 ounces (148 mL) of wine, or 1.5 ounces (44 mL) of liquor.   Avoid use of street drugs. Do not share needles with anyone. Ask for help if you need support or instructions about stopping the use of drugs.   High blood pressure causes heart disease and increases the risk of stroke. Blood pressure should be checked at least every 1 to 2 years. Ongoing high blood pressure should be treated with medicines if weight loss and exercise are not effective.   If you are 45 to 38 years old, ask your caregiver if you should take aspirin to prevent heart disease.   Diabetes screening involves taking a blood sample to check your fasting blood sugar level. This should be done once every 3 years, after age 45, if you are within normal weight and without risk factors for diabetes. Testing should be considered at a younger age or be carried out more frequently if you are overweight and have at least 1 risk factor for diabetes.   Colorectal cancer can be detected and often prevented. Most routine colorectal cancer screening begins at the age of 50 and continues through age 75. However, your caregiver may recommend screening at an earlier age if you have risk factors for colon cancer. On a yearly basis, your caregiver may provide home test kits to check for hidden   blood in the stool. Use of a small camera at the end of a tube, to directly examine the colon (sigmoidoscopy or colonoscopy), can detect the earliest forms of colorectal cancer. Talk to your caregiver about this at age 50, when routine screening begins. Direct examination of the colon should be repeated every 5 to 10 years through age 75, unless early forms of pre-cancerous polyps or small growths are found.   Healthy men should no longer receive prostate-specific antigen (PSA) blood tests as part of routine cancer screening. Consult with  your caregiver about prostate cancer screening.   Practice safe sex. Use condoms and avoid high-risk sexual practices to reduce the spread of sexually transmitted infections (STIs).   Use sunscreen with a sun protection factor (SPF) of 30 or greater. Apply sunscreen liberally and repeatedly throughout the day. You should seek shade when your shadow is shorter than you. Protect yourself by wearing long sleeves, pants, a wide-brimmed hat, and sunglasses year round, whenever you are outdoors.   Notify your caregiver of new moles or changes in moles, especially if there is a change in shape or color. Also notify your caregiver if a mole is larger than the size of a pencil eraser.   A one-time screening for abdominal aortic aneurysm (AAA) and surgical repair of large AAAs by sound wave imaging (ultrasonography) is recommended for ages 65 to 75 years who are current or former smokers.   Stay current with your immunizations.  Document Released: 02/13/2008 Document Revised: 04/29/2011 Document Reviewed: 01/12/2011 [employer] Patient Information 2012 [employer]. 

## 2011-09-04 ENCOUNTER — Encounter: Payer: Self-pay | Admitting: Internal Medicine

## 2011-09-04 MED ORDER — SERTRALINE HCL 100 MG PO TABS: 100.0000 mg | ORAL_TABLET | Freq: Every day | ORAL | Status: DC

## 2011-09-04 NOTE — Assessment & Plan Note (Addendum)
Exam done, labs ordered, vaccines updated, pt ed material was given. His EKG is normal.

## 2011-09-04 NOTE — Progress Notes (Signed)
Subjective:    Patient ID: [name] [name], male    DOB: 11/25/1975, 36 y.o.   MRN: [id number]  Hypertension This is a new problem. The current episode started more than 1 month ago. The problem is unchanged. The problem is uncontrolled. Associated symptoms include anxiety. Pertinent negatives include no blurred vision, chest pain, headaches, malaise/fatigue, neck pain, orthopnea, palpitations, peripheral edema, PND, shortness of breath or sweats. There are no associated agents to hypertension. Risk factors for coronary artery disease include no known risk factors. Past treatments include nothing. Compliance problems include exercise and diet.       Review of Systems  Constitutional: Negative for fever, chills, malaise/fatigue, diaphoresis, activity change, appetite change, fatigue and unexpected weight change.  HENT: Negative.  Negative for neck pain.   Eyes: Negative.  Negative for blurred vision.  Respiratory: Negative for cough, chest tightness, shortness of breath, wheezing and stridor.   Cardiovascular: Negative for chest pain, palpitations, orthopnea and PND.  Gastrointestinal: Negative for nausea, abdominal pain, diarrhea, constipation, blood in stool, abdominal distention and rectal pain.  Genitourinary: Negative for dysuria, urgency, frequency, hematuria, flank pain, decreased urine volume, discharge, penile swelling, scrotal swelling, enuresis, difficulty urinating, genital sores, penile pain and testicular pain.  Musculoskeletal: Negative for myalgias, back pain, joint swelling, arthralgias and gait problem.  Skin: Negative for color change, pallor, rash and wound.  Neurological: Negative for dizziness, tremors, seizures, syncope, facial asymmetry, speech difficulty, weakness, light-headedness, numbness and headaches.  Hematological: Negative for adenopathy. Does not bruise/bleed easily.  Psychiatric/Behavioral: Positive for sleep disturbance. Negative for suicidal ideas,  hallucinations, behavioral problems, confusion, self-injury, dysphoric mood, decreased concentration and agitation. The patient is nervous/anxious. The patient is not hyperactive.        Objective:   Physical Exam  Vitals reviewed. Constitutional: He is oriented to person, place, and time. He appears well-developed and well-nourished. No distress.  HENT:  Head: Normocephalic and atraumatic.  Mouth/Throat: Oropharynx is clear and moist. No oropharyngeal exudate.  Eyes: Conjunctivae are normal. Right eye exhibits no discharge. Left eye exhibits no discharge. No scleral icterus.  Neck: Normal range of motion. Neck supple. No JVD present. No tracheal deviation present. No thyromegaly present.  Cardiovascular: Normal rate, regular rhythm, normal heart sounds and intact distal pulses.  Exam reveals no gallop and no friction rub.   No murmur heard. Pulmonary/Chest: Effort normal and breath sounds normal. No stridor. No respiratory distress. He has no wheezes. He has no rales. He exhibits no tenderness.  Abdominal: Soft. Bowel sounds are normal. He exhibits no distension. There is no tenderness. There is no rebound and no guarding. Hernia confirmed negative in the right inguinal area and confirmed negative in the left inguinal area.  Genitourinary: Testes normal and penis normal. Right testis shows no mass, no swelling and no tenderness. Right testis is descended. Left testis shows no mass, no swelling and no tenderness. Left testis is descended. Uncircumcised. No phimosis, paraphimosis, hypospadias, penile erythema or penile tenderness. No discharge found.  Musculoskeletal: Normal range of motion. He exhibits no edema and no tenderness.  Lymphadenopathy:    He has no cervical adenopathy.       Right: No inguinal adenopathy present.       Left: No inguinal adenopathy present.  Neurological: He is oriented to person, place, and time.  Skin: Skin is warm and dry. No rash noted. He is not diaphoretic.  No erythema. No pallor.  Psychiatric: He has a normal mood and affect. His behavior is normal. Judgment and  thought content normal.      Lab Results  Component Value Date   WBC 8.7 09/03/2011   HGB 15.3 09/03/2011   HCT 46.0 09/03/2011   PLT 376.0 09/03/2011   GLUCOSE 97 09/03/2011   CHOL 238* 09/03/2011   TRIG 114.0 09/03/2011   HDL 43.60 09/03/2011   LDLDIRECT 172.4 09/03/2011   ALT 32 09/03/2011   AST 19 09/03/2011   NA 140 09/03/2011   K 4.3 09/03/2011   CL 103 09/03/2011   CREATININE 1.0 09/03/2011   BUN 14 09/03/2011   CO2 30 09/03/2011   TSH 0.51 09/03/2011   PSA 0.97 05/15/2010      Assessment & Plan:

## 2011-09-04 NOTE — Assessment & Plan Note (Signed)
Start propranolol

## 2011-09-04 NOTE — Assessment & Plan Note (Signed)
I have increased the dose on his zoloft to help reduce anxiety and panic, also he tells me that his therapist wants him to try propranolol to help with the performance aspect of this and his fear of public speaking

## 2011-09-11 ENCOUNTER — Telehealth: Payer: Self-pay

## 2011-09-11 DIAGNOSIS — F411 Generalized anxiety disorder: Secondary | ICD-10-CM

## 2011-09-11 MED ORDER — CLONAZEPAM 1 MG PO TABS: 1.0000 mg | ORAL_TABLET | Freq: Two times a day (BID) | ORAL | Status: DC | PRN

## 2011-09-11 NOTE — Telephone Encounter (Signed)
yes

## 2011-09-11 NOTE — Telephone Encounter (Signed)
Please advise if ok to refill clonazepam 1mg  for this patient, last seen 09/03/11. Thanks

## 2011-11-23 ENCOUNTER — Other Ambulatory Visit: Payer: Self-pay | Admitting: Internal Medicine

## 2012-05-13 ENCOUNTER — Telehealth: Payer: Self-pay | Admitting: Internal Medicine

## 2012-05-13 NOTE — Telephone Encounter (Signed)
He needs to be seen

## 2012-05-13 NOTE — Telephone Encounter (Signed)
Please advise if ok to refill. 

## 2012-05-13 NOTE — Telephone Encounter (Signed)
Pt has called his pharmacy ([facility] on [address]) to request refills on generic Klonopin and Propranolol.

## 2012-05-13 NOTE — Telephone Encounter (Signed)
LMOM to call for an appt. °

## 2012-05-16 NOTE — Telephone Encounter (Signed)
APPT SEPT 23

## 2012-05-23 ENCOUNTER — Ambulatory Visit (INDEPENDENT_AMBULATORY_CARE_PROVIDER_SITE_OTHER): Payer: BC Managed Care – PPO | Admitting: Internal Medicine

## 2012-05-23 ENCOUNTER — Encounter: Payer: Self-pay | Admitting: Internal Medicine

## 2012-05-23 VITALS — BP 128/80 | HR 80 | Temp 98.6°F | Resp 16 | Wt 213.8 lb

## 2012-05-23 DIAGNOSIS — I10 Essential (primary) hypertension: Secondary | ICD-10-CM

## 2012-05-23 DIAGNOSIS — E785 Hyperlipidemia, unspecified: Secondary | ICD-10-CM

## 2012-05-23 DIAGNOSIS — F411 Generalized anxiety disorder: Secondary | ICD-10-CM

## 2012-05-23 DIAGNOSIS — Z23 Encounter for immunization: Secondary | ICD-10-CM

## 2012-05-23 MED ORDER — CLONAZEPAM 1 MG PO TABS: 1.0000 mg | ORAL_TABLET | Freq: Two times a day (BID) | ORAL | Status: DC | PRN

## 2012-05-23 MED ORDER — PROPRANOLOL HCL 20 MG PO TABS: 20.0000 mg | ORAL_TABLET | Freq: Two times a day (BID) | ORAL | Status: DC

## 2012-05-23 MED ORDER — SERTRALINE HCL 50 MG PO TABS: 50.0000 mg | ORAL_TABLET | Freq: Every day | ORAL | Status: DC

## 2012-05-23 NOTE — Assessment & Plan Note (Signed)
Continue current meds and therapy with Vernell Leep

## 2012-05-23 NOTE — Assessment & Plan Note (Signed)
He is not willing to start a statin

## 2012-05-23 NOTE — Progress Notes (Signed)
Subjective:    Patient ID: [name] [name], male    DOB: 1976/05/31, 36 y.o.   MRN: [id number]  Hyperlipidemia This is a new problem. This is a new diagnosis. The problem is uncontrolled. Recent lipid tests were reviewed and are high. Exacerbating diseases include obesity. He has no history of chronic renal disease, diabetes, hypothyroidism, liver disease or nephrotic syndrome. Factors aggravating his hyperlipidemia include fatty foods and beta blockers. Pertinent negatives include no chest pain, focal sensory loss, focal weakness, leg pain, myalgias or shortness of breath. He is currently on no antihyperlipidemic treatment. The current treatment provides no improvement of lipids. Compliance problems include adherence to exercise and adherence to diet.       Review of Systems  Constitutional: Negative.   HENT: Negative.   Eyes: Negative.   Respiratory: Negative for cough, choking, chest tightness, shortness of breath, wheezing and stridor.   Cardiovascular: Negative for chest pain, palpitations and leg swelling.  Gastrointestinal: Negative for nausea, vomiting, abdominal pain, diarrhea, constipation and blood in stool.  Genitourinary: Negative.   Musculoskeletal: Negative.  Negative for myalgias.  Skin: Negative.   Neurological: Negative.  Negative for dizziness and focal weakness.  Hematological: Negative for adenopathy. Does not bruise/bleed easily.  Psychiatric/Behavioral: Negative for suicidal ideas, hallucinations, behavioral problems, confusion, disturbed wake/sleep cycle, self-injury, dysphoric mood, decreased concentration and agitation. The patient is nervous/anxious. The patient is not hyperactive.        Objective:   Physical Exam  Vitals reviewed. Constitutional: He is oriented to person, place, and time. He appears well-developed and well-nourished. No distress.  HENT:  Head: Normocephalic and atraumatic.  Mouth/Throat: Oropharynx is clear and moist. No oropharyngeal  exudate.  Eyes: Conjunctivae normal are normal. Right eye exhibits no discharge. Left eye exhibits no discharge. No scleral icterus.  Neck: Normal range of motion. Neck supple. No JVD present. No tracheal deviation present. No thyromegaly present.  Cardiovascular: Normal rate, regular rhythm, normal heart sounds and intact distal pulses.  Exam reveals no gallop and no friction rub.   No murmur heard. Pulmonary/Chest: Effort normal and breath sounds normal. No stridor. No respiratory distress. He has no wheezes. He has no rales. He exhibits no tenderness.  Abdominal: Soft. Bowel sounds are normal. He exhibits no distension and no mass. There is no tenderness. There is no rebound and no guarding.  Musculoskeletal: Normal range of motion. He exhibits no edema and no tenderness.  Lymphadenopathy:    He has no cervical adenopathy.  Neurological: He is oriented to person, place, and time.  Skin: Skin is warm and dry. No rash noted. He is not diaphoretic. No erythema.  Psychiatric: His speech is normal and behavior is normal. Judgment and thought content normal. His mood appears anxious. His affect is not angry, not blunt, not labile and not inappropriate. Cognition and memory are normal. He does not exhibit a depressed mood.     Lab Results  Component Value Date   WBC 8.7 09/03/2011   HGB 15.3 09/03/2011   HCT 46.0 09/03/2011   PLT 376.0 09/03/2011   GLUCOSE 97 09/03/2011   CHOL 238* 09/03/2011   TRIG 114.0 09/03/2011   HDL 43.60 09/03/2011   LDLDIRECT 172.4 09/03/2011   ALT 32 09/03/2011   AST 19 09/03/2011   NA 140 09/03/2011   K 4.3 09/03/2011   CL 103 09/03/2011   CREATININE 1.0 09/03/2011   BUN 14 09/03/2011   CO2 30 09/03/2011   TSH 0.51 09/03/2011   PSA 0.97 05/15/2010  Assessment & Plan:

## 2012-05-23 NOTE — Patient Instructions (Signed)

## 2012-05-23 NOTE — Assessment & Plan Note (Signed)
His BP is well controlled 

## 2012-06-23 ENCOUNTER — Other Ambulatory Visit: Payer: Self-pay

## 2012-06-23 MED ORDER — FINASTERIDE 1 MG PO TABS: ORAL_TABLET | ORAL | Status: DC

## 2012-09-05 ENCOUNTER — Ambulatory Visit (INDEPENDENT_AMBULATORY_CARE_PROVIDER_SITE_OTHER): Payer: BC Managed Care – PPO | Admitting: Internal Medicine

## 2012-09-05 ENCOUNTER — Encounter: Payer: Self-pay | Admitting: Internal Medicine

## 2012-09-05 VITALS — BP 124/70 | HR 69 | Temp 98.6°F | Resp 16 | Wt 211.5 lb

## 2012-09-05 DIAGNOSIS — F411 Generalized anxiety disorder: Secondary | ICD-10-CM

## 2012-09-05 DIAGNOSIS — I10 Essential (primary) hypertension: Secondary | ICD-10-CM

## 2012-09-05 DIAGNOSIS — Z0000 Encounter for general adult medical examination without abnormal findings: Secondary | ICD-10-CM

## 2012-09-05 MED ORDER — CLONAZEPAM 1 MG PO TABS: 1.0000 mg | ORAL_TABLET | Freq: Two times a day (BID) | ORAL | Status: DC | PRN

## 2012-09-05 MED ORDER — PROPRANOLOL HCL 20 MG PO TABS: 20.0000 mg | ORAL_TABLET | Freq: Two times a day (BID) | ORAL | Status: DC

## 2012-09-05 MED ORDER — SERTRALINE HCL 50 MG PO TABS: 50.0000 mg | ORAL_TABLET | Freq: Every day | ORAL | Status: DC

## 2012-09-05 MED ORDER — FINASTERIDE 1 MG PO TABS: ORAL_TABLET | ORAL | Status: DC

## 2012-09-05 NOTE — Patient Instructions (Signed)
Health Maintenance, Males A healthy lifestyle and preventative care can promote health and wellness.  Maintain regular health, dental, and eye exams.  Eat a healthy diet. Foods like vegetables, fruits, whole grains, low-fat dairy products, and lean protein foods contain the nutrients you need without too many calories. Decrease your intake of foods high in solid fats, added sugars, and salt. Get information about a proper diet from your caregiver, if necessary.  Regular physical exercise is one of the most important things you can do for your health. Most adults should get at least 150 minutes of moderate-intensity exercise (any activity that increases your heart rate and causes you to sweat) each week. In addition, most adults need muscle-strengthening exercises on 2 or more days a week.   Maintain a healthy weight. The body mass index (BMI) is a screening tool to identify possible weight problems. It provides an estimate of body fat based on height and weight. Your caregiver can help determine your BMI, and can help you achieve or maintain a healthy weight. For adults 20 years and older:  A BMI below 18.5 is considered underweight.  A BMI of 18.5 to 24.9 is normal.  A BMI of 25 to 29.9 is considered overweight.  A BMI of 30 and above is considered obese.  Maintain normal blood lipids and cholesterol by exercising and minimizing your intake of saturated fat. Eat a balanced diet with plenty of fruits and vegetables. Blood tests for lipids and cholesterol should begin at age 20 and be repeated every 5 years. If your lipid or cholesterol levels are high, you are over 50, or you are a high risk for heart disease, you may need your cholesterol levels checked more frequently.Ongoing high lipid and cholesterol levels should be treated with medicines, if diet and exercise are not effective.  If you smoke, find out from your caregiver how to quit. If you do not use tobacco, do not start.  If you  choose to drink alcohol, do not exceed 2 drinks per day. One drink is considered to be 12 ounces (355 mL) of beer, 5 ounces (148 mL) of wine, or 1.5 ounces (44 mL) of liquor.  Avoid use of street drugs. Do not share needles with anyone. Ask for help if you need support or instructions about stopping the use of drugs.  High blood pressure causes heart disease and increases the risk of stroke. Blood pressure should be checked at least every 1 to 2 years. Ongoing high blood pressure should be treated with medicines if weight loss and exercise are not effective.  If you are 45 to 39 years old, ask your caregiver if you should take aspirin to prevent heart disease.  Diabetes screening involves taking a blood sample to check your fasting blood sugar level. This should be done once every 3 years, after age 45, if you are within normal weight and without risk factors for diabetes. Testing should be considered at a younger age or be carried out more frequently if you are overweight and have at least 1 risk factor for diabetes.  Colorectal cancer can be detected and often prevented. Most routine colorectal cancer screening begins at the age of 50 and continues through age 75. However, your caregiver may recommend screening at an earlier age if you have risk factors for colon cancer. On a yearly basis, your caregiver may provide home test kits to check for hidden blood in the stool. Use of a small camera at the end of a tube,   to directly examine the colon (sigmoidoscopy or colonoscopy), can detect the earliest forms of colorectal cancer. Talk to your caregiver about this at age 50, when routine screening begins. Direct examination of the colon should be repeated every 5 to 10 years through age 75, unless early forms of pre-cancerous polyps or small growths are found.  Hepatitis C blood testing is recommended for all people born from 1945 through 1965 and any individual with known risks for hepatitis C.  Healthy  men should no longer receive prostate-specific antigen (PSA) blood tests as part of routine cancer screening. Consult with your caregiver about prostate cancer screening.  Testicular cancer screening is not recommended for adolescents or adult males who have no symptoms. Screening includes self-exam, caregiver exam, and other screening tests. Consult with your caregiver about any symptoms you have or any concerns you have about testicular cancer.  Practice safe sex. Use condoms and avoid high-risk sexual practices to reduce the spread of sexually transmitted infections (STIs).  Use sunscreen with a sun protection factor (SPF) of 30 or greater. Apply sunscreen liberally and repeatedly throughout the day. You should seek shade when your shadow is shorter than you. Protect yourself by wearing long sleeves, pants, a wide-brimmed hat, and sunglasses year round, whenever you are outdoors.  Notify your caregiver of new moles or changes in moles, especially if there is a change in shape or color. Also notify your caregiver if a mole is larger than the size of a pencil eraser.  A one-time screening for abdominal aortic aneurysm (AAA) and surgical repair of large AAAs by sound wave imaging (ultrasonography) is recommended for ages 65 to 75 years who are current or former smokers.  Stay current with your immunizations. Document Released: 02/13/2008 Document Revised: 11/09/2011 Document Reviewed: 01/12/2011 [employer] Patient Information 2013 [employer].  

## 2012-09-05 NOTE — Progress Notes (Signed)
Subjective:    Patient ID: [name] [name], male    DOB: 20-Feb-1976, 37 y.o.   MRN: [id number]  Hypertension This is a chronic problem. The current episode started more than 1 year ago. The problem has been gradually improving since onset. The problem is controlled. Associated symptoms include anxiety. Pertinent negatives include no blurred vision, chest pain, headaches, malaise/fatigue, neck pain, orthopnea, palpitations, peripheral edema, PND, shortness of breath or sweats. Agents associated with hypertension include NSAIDs. Past treatments include beta blockers. The current treatment provides moderate improvement. Compliance problems include exercise and diet.       Review of Systems  Constitutional: Negative.  Negative for malaise/fatigue.  HENT: Negative.  Negative for neck pain.   Eyes: Negative.  Negative for blurred vision.  Respiratory: Negative for apnea, cough, choking, chest tightness, shortness of breath, wheezing and stridor.   Cardiovascular: Negative for chest pain, palpitations, orthopnea, leg swelling and PND.  Gastrointestinal: Negative.   Genitourinary: Negative.  Negative for scrotal swelling, difficulty urinating, penile pain and testicular pain.  Musculoskeletal: Negative.   Neurological: Negative.  Negative for dizziness, weakness, light-headedness and headaches.  Hematological: Negative for adenopathy. Does not bruise/bleed easily.  Psychiatric/Behavioral: Positive for dysphoric mood. Negative for suicidal ideas, hallucinations, behavioral problems, confusion, sleep disturbance, self-injury, decreased concentration and agitation. The patient is nervous/anxious. The patient is not hyperactive.        Objective:   Physical Exam  Vitals reviewed. Constitutional: He is oriented to person, place, and time. He appears well-developed and well-nourished. No distress.  HENT:  Head: Normocephalic and atraumatic.  Mouth/Throat: Oropharynx is clear and moist. No oropharyngeal  exudate.  Eyes: Conjunctivae normal are normal. Right eye exhibits no discharge. Left eye exhibits no discharge. No scleral icterus.  Neck: Normal range of motion. Neck supple. No JVD present. No tracheal deviation present. No thyromegaly present.  Cardiovascular: Normal rate, regular rhythm, normal heart sounds and intact distal pulses.  Exam reveals no gallop and no friction rub.   No murmur heard. Pulmonary/Chest: Effort normal and breath sounds normal. No stridor. No respiratory distress. He has no wheezes. He has no rales. He exhibits no tenderness.  Abdominal: Soft. Bowel sounds are normal. He exhibits no distension and no mass. There is no tenderness. There is no rebound and no guarding. Hernia confirmed negative in the right inguinal area and confirmed negative in the left inguinal area.  Genitourinary: Testes normal and penis normal. Right testis shows no mass, no swelling and no tenderness. Right testis is descended. Left testis shows no mass, no swelling and no tenderness. Left testis is descended. Circumcised. No penile erythema or penile tenderness. No discharge found.  Musculoskeletal: Normal range of motion. He exhibits no edema and no tenderness.  Lymphadenopathy:    He has no cervical adenopathy.       Right: No inguinal adenopathy present.       Left: No inguinal adenopathy present.  Neurological: He is oriented to person, place, and time.  Skin: Skin is warm and dry. No rash noted. He is not diaphoretic. No erythema. No pallor.  Psychiatric: He has a normal mood and affect. His behavior is normal. Judgment and thought content normal.     Lab Results  Component Value Date   WBC 8.7 09/03/2011   HGB 15.3 09/03/2011   HCT 46.0 09/03/2011   PLT 376.0 09/03/2011   GLUCOSE 97 09/03/2011   CHOL 238* 09/03/2011   TRIG 114.0 09/03/2011   HDL 43.60 09/03/2011   LDLDIRECT 172.4 09/03/2011  ALT 32 09/03/2011   AST 19 09/03/2011   NA 140 09/03/2011   K 4.3 09/03/2011   CL 103 09/03/2011   CREATININE  1.0 09/03/2011   BUN 14 09/03/2011   CO2 30 09/03/2011   TSH 0.51 09/03/2011   PSA 0.97 05/15/2010       Assessment & Plan:

## 2012-09-05 NOTE — Assessment & Plan Note (Signed)
Exam done Vaccines were reviewed Labs ordered Pt ed material was given 

## 2012-09-09 ENCOUNTER — Other Ambulatory Visit (INDEPENDENT_AMBULATORY_CARE_PROVIDER_SITE_OTHER): Payer: BC Managed Care – PPO

## 2012-09-09 ENCOUNTER — Encounter: Payer: Self-pay | Admitting: Internal Medicine

## 2012-09-09 DIAGNOSIS — Z0000 Encounter for general adult medical examination without abnormal findings: Secondary | ICD-10-CM

## 2012-09-09 LAB — COMPREHENSIVE METABOLIC PANEL
Albumin: 4.3 g/dL (ref 3.5–5.2)
BUN: 14 mg/dL (ref 6–23)
CO2: 28 mEq/L (ref 19–32)
Calcium: 9.4 mg/dL (ref 8.4–10.5)
Chloride: 104 mEq/L (ref 96–112)
GFR: 100.17 mL/min (ref >60.00)
Glucose, Bld: 103 mg/dL — ABNORMAL HIGH (ref 70–99)
Potassium: 4.4 mEq/L (ref 3.5–5.1)

## 2012-09-09 LAB — CBC WITH DIFFERENTIAL/PLATELET
Basophils Relative: 0.5 % (ref 0.0–3.0)
Eosinophils Absolute: 0.2 10*3/uL (ref 0.0–0.7)
Eosinophils Relative: 2.3 % (ref 0.0–5.0)
Lymphocytes Relative: 28.9 % (ref 12.0–46.0)
Neutrophils Relative %: 61 % (ref 43.0–77.0)
RBC: 5.24 Mil/uL (ref 4.22–5.81)
WBC: 9.1 10*3/uL (ref 4.5–10.5)

## 2012-09-09 LAB — URINALYSIS, ROUTINE W REFLEX MICROSCOPIC
Hgb urine dipstick: NEGATIVE
Nitrite: NEGATIVE
Total Protein, Urine: NEGATIVE
Urobilinogen, UA: 0.2 (ref 0.0–1.0)

## 2012-09-09 LAB — LIPID PANEL: HDL: 47.2 mg/dL (ref >39.00)

## 2012-09-09 LAB — LDL CHOLESTEROL, DIRECT: Direct LDL: 158 mg/dL

## 2012-12-12 ENCOUNTER — Telehealth: Payer: Self-pay

## 2012-12-12 DIAGNOSIS — F411 Generalized anxiety disorder: Secondary | ICD-10-CM

## 2012-12-12 NOTE — Telephone Encounter (Signed)
ok 

## 2012-12-12 NOTE — Telephone Encounter (Signed)
Please advise if ok ot refill clonazepam 1 mg for this pt last seen 1/14

## 2012-12-13 MED ORDER — CLONAZEPAM 1 MG PO TABS: 1.0000 mg | ORAL_TABLET | Freq: Two times a day (BID) | ORAL | Status: DC | PRN

## 2013-05-29 ENCOUNTER — Other Ambulatory Visit: Payer: Self-pay | Admitting: Internal Medicine

## 2013-08-12 ENCOUNTER — Other Ambulatory Visit: Payer: Self-pay | Admitting: Internal Medicine

## 2013-08-16 ENCOUNTER — Other Ambulatory Visit: Payer: Self-pay | Admitting: Internal Medicine

## 2013-08-22 ENCOUNTER — Other Ambulatory Visit: Payer: Self-pay | Admitting: Internal Medicine

## 2014-02-26 ENCOUNTER — Encounter: Payer: Self-pay | Admitting: Internal Medicine

## 2014-02-26 ENCOUNTER — Other Ambulatory Visit (INDEPENDENT_AMBULATORY_CARE_PROVIDER_SITE_OTHER): Payer: BC Managed Care – PPO

## 2014-02-26 ENCOUNTER — Ambulatory Visit (INDEPENDENT_AMBULATORY_CARE_PROVIDER_SITE_OTHER): Payer: BC Managed Care – PPO | Admitting: Internal Medicine

## 2014-02-26 VITALS — BP 110/70 | HR 70 | Temp 98.2°F | Resp 16 | Ht 70.0 in | Wt 189.0 lb

## 2014-02-26 DIAGNOSIS — Z0000 Encounter for general adult medical examination without abnormal findings: Secondary | ICD-10-CM

## 2014-02-26 DIAGNOSIS — E785 Hyperlipidemia, unspecified: Secondary | ICD-10-CM

## 2014-02-26 DIAGNOSIS — I10 Essential (primary) hypertension: Secondary | ICD-10-CM

## 2014-02-26 DIAGNOSIS — F411 Generalized anxiety disorder: Secondary | ICD-10-CM

## 2014-02-26 LAB — LIPID PANEL
CHOL/HDL RATIO: 4
Cholesterol: 220 mg/dL — ABNORMAL HIGH (ref 0–200)
HDL: 54.2 mg/dL (ref >39.00)
LDL CALC: 121 mg/dL — AB (ref 0–99)
NONHDL: 165.8
Triglycerides: 225 mg/dL — ABNORMAL HIGH (ref 0.0–149.0)
VLDL: 45 mg/dL — ABNORMAL HIGH (ref 0.0–40.0)

## 2014-02-26 LAB — CBC WITH DIFFERENTIAL/PLATELET
BASOS PCT: 0.5 % (ref 0.0–3.0)
Basophils Absolute: 0 10*3/uL (ref 0.0–0.1)
Eosinophils Absolute: 0.3 10*3/uL (ref 0.0–0.7)
Eosinophils Relative: 3.4 % (ref 0.0–5.0)
HEMATOCRIT: 41 % (ref 39.0–52.0)
HEMOGLOBIN: 13.8 g/dL (ref 13.0–17.0)
LYMPHS ABS: 2.9 10*3/uL (ref 0.7–4.0)
Lymphocytes Relative: 30.4 % (ref 12.0–46.0)
MCHC: 33.6 g/dL (ref 30.0–36.0)
MCV: 87 fl (ref 78.0–100.0)
MONOS PCT: 7.8 % (ref 3.0–12.0)
Monocytes Absolute: 0.7 10*3/uL (ref 0.1–1.0)
NEUTROS ABS: 5.4 10*3/uL (ref 1.4–7.7)
Neutrophils Relative %: 57.9 % (ref 43.0–77.0)
Platelets: 312 10*3/uL (ref 150.0–400.0)
RBC: 4.72 Mil/uL (ref 4.22–5.81)
RDW: 14.4 % (ref 11.5–15.5)
WBC: 9.4 10*3/uL (ref 4.0–10.5)

## 2014-02-26 LAB — COMPREHENSIVE METABOLIC PANEL
ALT: 24 U/L (ref 0–53)
AST: 25 U/L (ref 0–37)
Albumin: 4.3 g/dL (ref 3.5–5.2)
Alkaline Phosphatase: 46 U/L (ref 39–117)
BILIRUBIN TOTAL: 0.3 mg/dL (ref 0.2–1.2)
BUN: 14 mg/dL (ref 6–23)
CO2: 28 mEq/L (ref 19–32)
Calcium: 9.4 mg/dL (ref 8.4–10.5)
Chloride: 104 mEq/L (ref 96–112)
Creatinine, Ser: 0.9 mg/dL (ref 0.4–1.5)
GFR: 98.15 mL/min (ref >60.00)
Glucose, Bld: 104 mg/dL — ABNORMAL HIGH (ref 70–99)
Potassium: 4.1 mEq/L (ref 3.5–5.1)
Sodium: 140 mEq/L (ref 135–145)
Total Protein: 7.3 g/dL (ref 6.0–8.3)

## 2014-02-26 LAB — TSH: TSH: 0.38 u[IU]/mL (ref 0.35–4.50)

## 2014-02-26 MED ORDER — SERTRALINE HCL 50 MG PO TABS: ORAL_TABLET | ORAL | Status: DC

## 2014-02-26 MED ORDER — CLONAZEPAM 1 MG PO TABS: ORAL_TABLET | ORAL | Status: DC

## 2014-02-26 MED ORDER — FINASTERIDE 1 MG PO TABS: ORAL_TABLET | ORAL | Status: DC

## 2014-02-26 MED ORDER — PROPRANOLOL HCL 20 MG PO TABS: ORAL_TABLET | ORAL | Status: DC

## 2014-02-26 NOTE — Assessment & Plan Note (Signed)
I will recheck his FLP and will treat if indicated

## 2014-02-26 NOTE — Progress Notes (Signed)
Subjective:    Patient ID: [name] [name], male    DOB: 10-Feb-1976, 38 y.o.   MRN: [id number]  Hypertension This is a chronic problem. The current episode started more than 1 year ago. The problem has been gradually improving since onset. The problem is controlled. Associated symptoms include anxiety. Pertinent negatives include no blurred vision, chest pain, headaches, malaise/fatigue, neck pain, orthopnea, palpitations, peripheral edema, PND, shortness of breath or sweats. There are no associated agents to hypertension. Past treatments include beta blockers. The current treatment provides significant improvement. There are no compliance problems.       Review of Systems  Constitutional: Negative.  Negative for fever, chills, malaise/fatigue, diaphoresis, activity change, appetite change, fatigue and unexpected weight change.  HENT: Negative.   Eyes: Negative.  Negative for blurred vision.  Respiratory: Negative.  Negative for apnea, cough, choking, chest tightness, shortness of breath, wheezing and stridor.   Cardiovascular: Negative.  Negative for chest pain, palpitations, orthopnea, leg swelling and PND.  Gastrointestinal: Negative.  Negative for nausea, vomiting, abdominal pain, diarrhea, constipation and blood in stool.  Endocrine: Negative.   Genitourinary: Negative.   Musculoskeletal: Negative.  Negative for arthralgias, back pain, gait problem, joint swelling, myalgias, neck pain and neck stiffness.  Skin: Negative.  Negative for rash.  Allergic/Immunologic: Negative.   Neurological: Negative.  Negative for headaches.  Hematological: Negative.  Negative for adenopathy. Does not bruise/bleed easily.  Psychiatric/Behavioral: Negative for suicidal ideas, hallucinations, behavioral problems, confusion, sleep disturbance, self-injury, dysphoric mood, decreased concentration and agitation. The patient is nervous/anxious. The patient is not hyperactive.        Objective:   Physical  Exam  Vitals reviewed. Constitutional: He is oriented to person, place, and time. He appears well-developed and well-nourished. No distress.  HENT:  Head: Normocephalic and atraumatic.  Mouth/Throat: Oropharynx is clear and moist. No oropharyngeal exudate.  Eyes: Conjunctivae are normal. Right eye exhibits no discharge. Left eye exhibits no discharge. No scleral icterus.  Neck: Normal range of motion. Neck supple. No JVD present. No tracheal deviation present. No thyromegaly present.  Cardiovascular: Normal rate, regular rhythm, normal heart sounds and intact distal pulses.  Exam reveals no gallop and no friction rub.   No murmur heard. Pulmonary/Chest: Effort normal and breath sounds normal. No stridor. No respiratory distress. He has no wheezes. He has no rales. He exhibits no tenderness.  Abdominal: Soft. Bowel sounds are normal. He exhibits no distension and no mass. There is no tenderness. There is no rebound and no guarding. Hernia confirmed negative in the right inguinal area and confirmed negative in the left inguinal area.  Genitourinary: Testes normal and penis normal. Right testis shows no mass, no swelling and no tenderness. Right testis is descended. Left testis shows no mass, no swelling and no tenderness. Left testis is descended. Circumcised. No penile erythema or penile tenderness. No discharge found.  Musculoskeletal: Normal range of motion. He exhibits no edema and no tenderness.  Lymphadenopathy:    He has no cervical adenopathy.       Right: No inguinal adenopathy present.       Left: No inguinal adenopathy present.  Neurological: He is oriented to person, place, and time.  Skin: Skin is warm and dry. No rash noted. He is not diaphoretic. No erythema. No pallor.  Psychiatric: He has a normal mood and affect. His behavior is normal. Judgment and thought content normal. His mood appears not anxious. His speech is not rapid and/or pressured, not delayed and not tangential.  Cognition and memory are not impaired. He does not exhibit a depressed mood. He expresses no homicidal and no suicidal ideation. He expresses no suicidal plans and no homicidal plans. He exhibits normal recent memory and normal remote memory.     Lab Results  Component Value Date   WBC 9.1 09/09/2012   HGB 15.0 09/09/2012   HCT 45.2 09/09/2012   PLT 381.0 09/09/2012   GLUCOSE 103* 09/09/2012   CHOL 226* 09/09/2012   TRIG 104.0 09/09/2012   HDL 47.20 09/09/2012   LDLDIRECT 158.0 09/09/2012   ALT 32 09/09/2012   AST 19 09/09/2012   NA 140 09/09/2012   K 4.4 09/09/2012   CL 104 09/09/2012   CREATININE 0.9 09/09/2012   BUN 14 09/09/2012   CO2 28 09/09/2012   TSH 0.70 09/09/2012   PSA 0.97 05/15/2010       Assessment & Plan:

## 2014-02-26 NOTE — Assessment & Plan Note (Signed)
Will cont the current meds for now

## 2014-02-26 NOTE — Patient Instructions (Signed)

## 2014-02-26 NOTE — Assessment & Plan Note (Signed)
Exam done Vaccines were reviewed Labs ordered Pt ed material was given 

## 2014-02-26 NOTE — Assessment & Plan Note (Signed)
His BP is well controlled 

## 2014-02-27 ENCOUNTER — Telehealth: Payer: Self-pay | Admitting: Internal Medicine

## 2014-02-27 NOTE — Telephone Encounter (Signed)
Relevant patient education assigned to patient using Emmi. ° °

## 2014-07-20 ENCOUNTER — Other Ambulatory Visit (INDEPENDENT_AMBULATORY_CARE_PROVIDER_SITE_OTHER): Payer: BC Managed Care – PPO

## 2014-07-20 ENCOUNTER — Ambulatory Visit (INDEPENDENT_AMBULATORY_CARE_PROVIDER_SITE_OTHER)
Admission: RE | Admit: 2014-07-20 | Discharge: 2014-07-20 | Disposition: A | Discharge status: Home / Self Care | Payer: BC Managed Care – PPO | Source: Ambulatory Visit | Attending: Internal Medicine | Admitting: Internal Medicine

## 2014-07-20 ENCOUNTER — Encounter: Payer: Self-pay | Admitting: Internal Medicine

## 2014-07-20 ENCOUNTER — Ambulatory Visit (INDEPENDENT_AMBULATORY_CARE_PROVIDER_SITE_OTHER): Payer: BC Managed Care – PPO | Admitting: Internal Medicine

## 2014-07-20 VITALS — BP 110/70 | HR 61 | Temp 97.9°F | Resp 16 | Ht 70.0 in | Wt 184.0 lb

## 2014-07-20 DIAGNOSIS — Z23 Encounter for immunization: Secondary | ICD-10-CM

## 2014-07-20 DIAGNOSIS — R10811 Right upper quadrant abdominal tenderness: Secondary | ICD-10-CM

## 2014-07-20 DIAGNOSIS — K8020 Calculus of gallbladder without cholecystitis without obstruction: Secondary | ICD-10-CM

## 2014-07-20 LAB — AMYLASE: AMYLASE: 50 U/L (ref 27–131)

## 2014-07-20 LAB — CBC WITH DIFFERENTIAL/PLATELET
BASOS ABS: 0 10*3/uL (ref 0.0–0.1)
Basophils Relative: 0.4 % (ref 0.0–3.0)
EOS PCT: 2.8 % (ref 0.0–5.0)
Eosinophils Absolute: 0.3 10*3/uL (ref 0.0–0.7)
HCT: 40.4 % (ref 39.0–52.0)
Hemoglobin: 13.2 g/dL (ref 13.0–17.0)
LYMPHS ABS: 3 10*3/uL (ref 0.7–4.0)
LYMPHS PCT: 25.4 % (ref 12.0–46.0)
MCHC: 32.6 g/dL (ref 30.0–36.0)
MCV: 87.2 fl (ref 78.0–100.0)
MONOS PCT: 7.2 % (ref 3.0–12.0)
Monocytes Absolute: 0.9 10*3/uL (ref 0.1–1.0)
NEUTROS PCT: 64.2 % (ref 43.0–77.0)
Neutro Abs: 7.6 10*3/uL (ref 1.4–7.7)
PLATELETS: 487 10*3/uL — AB (ref 150.0–400.0)
RBC: 4.64 Mil/uL (ref 4.22–5.81)
RDW: 13.7 % (ref 11.5–15.5)
WBC: 11.9 10*3/uL — AB (ref 4.0–10.5)

## 2014-07-20 LAB — COMPREHENSIVE METABOLIC PANEL
ALBUMIN: 3.8 g/dL (ref 3.5–5.2)
ALT: 17 U/L (ref 0–53)
AST: 18 U/L (ref 0–37)
Alkaline Phosphatase: 53 U/L (ref 39–117)
BUN: 10 mg/dL (ref 6–23)
CALCIUM: 9.4 mg/dL (ref 8.4–10.5)
CHLORIDE: 103 meq/L (ref 96–112)
CO2: 29 mEq/L (ref 19–32)
Creatinine, Ser: 0.8 mg/dL (ref 0.4–1.5)
GFR: 112.04 mL/min (ref >60.00)
Glucose, Bld: 94 mg/dL (ref 70–99)
Potassium: 4.1 mEq/L (ref 3.5–5.1)
SODIUM: 138 meq/L (ref 135–145)
TOTAL PROTEIN: 7.3 g/dL (ref 6.0–8.3)
Total Bilirubin: 0.7 mg/dL (ref 0.2–1.2)

## 2014-07-20 LAB — URINALYSIS, ROUTINE W REFLEX MICROSCOPIC
Bilirubin Urine: NEGATIVE
Hgb urine dipstick: NEGATIVE
Ketones, ur: NEGATIVE
LEUKOCYTES UA: NEGATIVE
NITRITE: NEGATIVE
SPECIFIC GRAVITY, URINE: 1.01 (ref 1.000–1.030)
Total Protein, Urine: NEGATIVE
UROBILINOGEN UA: 0.2 (ref 0.0–1.0)
Urine Glucose: NEGATIVE
pH: 8.5 — AB (ref 5.0–8.0)

## 2014-07-20 LAB — LIPASE: LIPASE: 35 U/L (ref 11.0–59.0)

## 2014-07-20 LAB — FECAL OCCULT BLOOD, GUAIAC: Fecal Occult Blood: NEGATIVE

## 2014-07-20 MED ORDER — RABEPRAZOLE SODIUM 20 MG PO TBEC: 20.0000 mg | DELAYED_RELEASE_TABLET | Freq: Every day | ORAL | Status: DC

## 2014-07-20 NOTE — Patient Instructions (Signed)

## 2014-07-20 NOTE — Progress Notes (Signed)
Pre visit review using our clinic review tool, if applicable. No additional management support is needed unless otherwise documented below in the visit note. 

## 2014-07-22 DIAGNOSIS — K8020 Calculus of gallbladder without cholecystitis without obstruction: Secondary | ICD-10-CM | POA: Insufficient documentation

## 2014-07-22 NOTE — Assessment & Plan Note (Signed)
There is no evidence of acute obstruction or infectious complication Will refer to GS to consider cholecestectomy

## 2014-07-22 NOTE — Assessment & Plan Note (Signed)
His exam does not indicate anything acute His labs are WNL Plain films shows a gallstone - will refer to general surgery

## 2014-07-22 NOTE — Progress Notes (Signed)
Subjective:    Patient ID: [name] [name], male    DOB: [birth year], 38 y.o.   MRN: [id number]  Abdominal Pain This is a recurrent problem. The current episode started 1 to 4 weeks ago. The onset quality is gradual. The problem occurs intermittently. The problem has been gradually improving. The pain is located in the RUQ. The pain is at a severity of 2/10. The pain is mild. The quality of the pain is colicky. The abdominal pain radiates to the right flank. Pertinent negatives include no anorexia, arthralgias, belching, constipation, diarrhea, dysuria, fever, flatus, frequency, headaches, hematochezia, hematuria, melena, myalgias, nausea, vomiting or weight loss. The pain is aggravated by eating. The pain is relieved by nothing. He has tried nothing for the symptoms. The treatment provided no relief.      Review of Systems  Constitutional: Negative.  Negative for fever, chills, weight loss, diaphoresis, appetite change and fatigue.  HENT: Negative.   Eyes: Negative.   Respiratory: Negative.  Negative for cough, choking, chest tightness, shortness of breath and stridor.   Cardiovascular: Negative.  Negative for chest pain, palpitations and leg swelling.  Gastrointestinal: Positive for abdominal pain. Negative for nausea, vomiting, diarrhea, constipation, melena, hematochezia, anorexia and flatus.  Endocrine: Negative.   Genitourinary: Positive for flank pain. Negative for dysuria, urgency, frequency, hematuria, scrotal swelling, difficulty urinating and testicular pain.  Musculoskeletal: Negative.  Negative for myalgias and arthralgias.  Skin: Negative.   Allergic/Immunologic: Negative.   Neurological: Negative.  Negative for dizziness and headaches.  Hematological: Negative.  Negative for adenopathy. Does not bruise/bleed easily.  Psychiatric/Behavioral: Negative.        Objective:   Physical Exam  Constitutional: He is oriented to person, place, and time. He appears well-developed and  well-nourished.  Non-toxic appearance. He does not have a sickly appearance. He does not appear ill. No distress.  HENT:  Head: Normocephalic and atraumatic.  Mouth/Throat: Oropharynx is clear and moist. No oropharyngeal exudate.  Eyes: Conjunctivae are normal. Right eye exhibits no discharge. Left eye exhibits no discharge. No scleral icterus.  Neck: Normal range of motion. Neck supple. No JVD present. No tracheal deviation present. No thyromegaly present.  Cardiovascular: Normal rate, regular rhythm, normal heart sounds and intact distal pulses.  Exam reveals no gallop and no friction rub.   No murmur heard. Pulmonary/Chest: Effort normal and breath sounds normal. No stridor. No respiratory distress. He has no wheezes. He has no rales. He exhibits no tenderness.  Abdominal: Soft. Normal appearance and bowel sounds are normal. He exhibits no distension and no mass. There is no hepatosplenomegaly, splenomegaly or hepatomegaly. There is tenderness in the right upper quadrant. There is no rigidity, no rebound, no guarding, no CVA tenderness, no tenderness at McBurney's point and negative Murphy's sign. No hernia. Hernia confirmed negative in the ventral area, confirmed negative in the right inguinal area and confirmed negative in the left inguinal area.  Genitourinary: Rectum normal, prostate normal, testes normal and penis normal. Rectal exam shows no external hemorrhoid, no internal hemorrhoid, no fissure, no mass, no tenderness and anal tone normal. Guaiac negative stool. Prostate is not enlarged and not tender. Right testis shows no mass, no swelling and no tenderness. Right testis is descended. Left testis shows no mass, no swelling and no tenderness. Left testis is descended. No penile erythema or penile tenderness. No discharge found.  Musculoskeletal: Normal range of motion. He exhibits no edema or tenderness.  Lymphadenopathy:    He has no cervical adenopathy.  Right: No inguinal adenopathy  present.  Neurological: He is oriented to person, place, and time.  Skin: Skin is warm and dry. No rash noted. He is not diaphoretic. No erythema. No pallor.  Psychiatric: He has a normal mood and affect. His behavior is normal. Judgment and thought content normal.  Vitals reviewed.    Lab Results  Component Value Date   WBC 11.9* 07/20/2014   HGB 13.2 07/20/2014   HCT 40.4 07/20/2014   PLT 487.0* 07/20/2014   GLUCOSE 94 07/20/2014   CHOL 220* 02/26/2014   TRIG 225.0* 02/26/2014   HDL 54.20 02/26/2014   LDLDIRECT 158.0 09/09/2012   LDLCALC 121* 02/26/2014   ALT 17 07/20/2014   AST 18 07/20/2014   NA 138 07/20/2014   K 4.1 07/20/2014   CL 103 07/20/2014   CREATININE 0.8 07/20/2014   BUN 10 07/20/2014   CO2 29 07/20/2014   TSH 0.38 02/26/2014   PSA 0.97 05/15/2010       Assessment & Plan:

## 2014-08-08 ENCOUNTER — Ambulatory Visit (INDEPENDENT_AMBULATORY_CARE_PROVIDER_SITE_OTHER): Payer: Self-pay | Admitting: General Surgery

## 2014-08-21 NOTE — Pre-Procedure Instructions (Signed)
[name] [name]  08/21/2014   Your procedure is scheduled on:  Wednesday, December 30th  [name] at 630 AM.  Call this number if you have problems the morning of surgery: [phone]   Remember:   Do not eat food or drink liquids after midnight.   Take these medicines the morning of surgery with A SIP OF WATER: propranolol, klonopin if needed   Do not wear jewelry  Do not wear lotions, powders, or perfume, deodorant.  Do not shave 48 hours prior to surgery. Men may shave face and neck.  Do not bring valuables to the hospital.  [name] is not responsible  for any belongings or valuables.               Contacts, dentures or bridgework may not be worn into surgery.  Leave suitcase in the car. After surgery it may be brought to your room.  For patients admitted to the hospital, discharge time is determined by your  treatment team.               Patients discharged the day of surgery will not be allowed to drive home.  Please read over the following fact sheets that you were given: Pain Booklet, Coughing and Deep Breathing and Surgical Site Infection Prevention  [name] - Preparing for Surgery  Before surgery, you can play an important role.  Because skin is not sterile, your skin needs to be as free of germs as possible.  You can reduce the number of germs on you skin by washing with CHG (chlorahexidine gluconate) soap before surgery.  CHG is an antiseptic cleaner which kills germs and bonds with the skin to continue killing germs even after washing.  Please DO NOT use if you have an allergy to CHG or antibacterial soaps.  If your skin becomes reddened/irritated stop using the CHG and inform your nurse when you arrive at Short Stay.  Do not shave (including legs and underarms) for at least 48 hours prior to the first CHG shower.  You may shave your face.  Please follow these instructions carefully:   1.  Shower with CHG Soap the night before  surgery and the morning of Surgery.  2.  If you choose to wash your hair, wash your hair first as usual with your normal shampoo.  3.  After you shampoo, rinse your hair and body thoroughly to remove the shampoo.  4.  Use CHG as you would any other liquid soap.  You can apply CHG directly to the skin and wash gently with scrungie or a clean washcloth.  5.  Apply the CHG Soap to your body ONLY FROM THE NECK DOWN.  Do not use on open wounds or open sores.  Avoid contact with your eyes, ears, mouth and genitals (private parts).  Wash genitals (private parts) with your normal soap.  6.  Wash thoroughly, paying special attention to the area where your surgery will be performed.  7.  Thoroughly rinse your body with warm water from the neck down.  8.  DO NOT shower/wash with your normal soap after using and rinsing off the CHG Soap.  9.  Pat yourself dry with a clean towel.            10.  Wear clean pajamas.            11.  Place clean sheets on your bed the night of your first shower and do not sleep  with pets.  Day of Surgery  Do not apply any lotions/deoderants the morning of surgery.  Please wear clean clothes to the hospital/surgery center.

## 2014-08-22 ENCOUNTER — Encounter (HOSPITAL_COMMUNITY): Payer: Self-pay

## 2014-08-22 ENCOUNTER — Encounter (HOSPITAL_COMMUNITY)
Admission: RE | Admit: 2014-08-22 | Discharge: 2014-08-22 | Disposition: A | Discharge status: Home / Self Care | Payer: BC Managed Care – PPO | Source: Ambulatory Visit | Attending: General Surgery | Admitting: General Surgery

## 2014-08-22 DIAGNOSIS — F419 Anxiety disorder, unspecified: Secondary | ICD-10-CM | POA: Insufficient documentation

## 2014-08-22 DIAGNOSIS — Z01812 Encounter for preprocedural laboratory examination: Secondary | ICD-10-CM | POA: Insufficient documentation

## 2014-08-22 LAB — BASIC METABOLIC PANEL
ANION GAP: 8 (ref 5–15)
BUN: 9 mg/dL (ref 6–23)
CHLORIDE: 99 meq/L (ref 96–112)
CO2: 28 mmol/L (ref 19–32)
Calcium: 9.4 mg/dL (ref 8.4–10.5)
Creatinine, Ser: 0.91 mg/dL (ref 0.50–1.35)
GFR calc non Af Amer: >90 mL/min (ref >90)
Glucose, Bld: 119 mg/dL — ABNORMAL HIGH (ref 70–99)
POTASSIUM: 4.2 mmol/L (ref 3.5–5.1)
Sodium: 135 mmol/L (ref 135–145)

## 2014-08-22 LAB — CBC
HEMATOCRIT: 43.2 % (ref 39.0–52.0)
HEMOGLOBIN: 14.3 g/dL (ref 13.0–17.0)
MCH: 29.1 pg (ref 26.0–34.0)
MCHC: 33.1 g/dL (ref 30.0–36.0)
MCV: 88 fL (ref 78.0–100.0)
Platelets: 315 10*3/uL (ref 150–400)
RBC: 4.91 MIL/uL (ref 4.22–5.81)
RDW: 14.8 % (ref 11.5–15.5)
WBC: 10 10*3/uL (ref 4.0–10.5)

## 2014-08-22 NOTE — Progress Notes (Signed)
Primary - dr. Yetta BarreJones No cardiology No prior cardiac testing  on propranolol for anxiety not bp

## 2014-08-28 MED ORDER — CEFAZOLIN SODIUM-DEXTROSE 2-3 GM-% IV SOLR
2.0000 g | INTRAVENOUS | Status: AC
  Administered 2014-08-29: 2 g via INTRAVENOUS
  Filled 2014-08-28: qty 50

## 2014-08-28 MED ORDER — CHLORHEXIDINE GLUCONATE 4 % EX LIQD
1.0000 "application " | Freq: Once | CUTANEOUS | Status: DC
  Filled 2014-08-28: qty 15

## 2014-08-29 ENCOUNTER — Ambulatory Visit (HOSPITAL_COMMUNITY)
Admission: RE | Admit: 2014-08-29 | Discharge: 2014-08-29 | Disposition: A | Discharge status: Home / Self Care | Payer: BC Managed Care – PPO | Source: Ambulatory Visit | Attending: General Surgery | Admitting: General Surgery

## 2014-08-29 ENCOUNTER — Telehealth (INDEPENDENT_AMBULATORY_CARE_PROVIDER_SITE_OTHER): Payer: Self-pay

## 2014-08-29 ENCOUNTER — Ambulatory Visit (HOSPITAL_COMMUNITY): Payer: BC Managed Care – PPO

## 2014-08-29 ENCOUNTER — Ambulatory Visit (HOSPITAL_COMMUNITY): Payer: BC Managed Care – PPO | Admitting: Certified Registered Nurse Anesthetist

## 2014-08-29 ENCOUNTER — Encounter (HOSPITAL_COMMUNITY): Payer: Self-pay | Admitting: Surgery

## 2014-08-29 ENCOUNTER — Encounter (HOSPITAL_COMMUNITY)
Admission: RE | Disposition: A | Discharge status: Home / Self Care | Payer: Self-pay | Source: Ambulatory Visit | Attending: General Surgery

## 2014-08-29 DIAGNOSIS — K811 Chronic cholecystitis: Secondary | ICD-10-CM | POA: Insufficient documentation

## 2014-08-29 DIAGNOSIS — K8020 Calculus of gallbladder without cholecystitis without obstruction: Secondary | ICD-10-CM

## 2014-08-29 DIAGNOSIS — F329 Major depressive disorder, single episode, unspecified: Secondary | ICD-10-CM | POA: Diagnosis not present

## 2014-08-29 HISTORY — PX: CHOLECYSTECTOMY: SHX55

## 2014-08-29 SURGERY — LAPAROSCOPIC CHOLECYSTECTOMY WITH INTRAOPERATIVE CHOLANGIOGRAM
Anesthesia: General | Site: Abdomen

## 2014-08-29 MED ORDER — FENTANYL CITRATE 0.05 MG/ML IJ SOLN
INTRAMUSCULAR | Status: DC | PRN
  Administered 2014-08-29 (×6): 50 ug via INTRAVENOUS
  Administered 2014-08-29 (×4): 25 ug via INTRAVENOUS

## 2014-08-29 MED ORDER — LIDOCAINE HCL (CARDIAC) 20 MG/ML IV SOLN
INTRAVENOUS | Status: DC | PRN
  Administered 2014-08-29: 80 mg via INTRAVENOUS

## 2014-08-29 MED ORDER — OXYCODONE HCL 5 MG/5ML PO SOLN: 5.0000 mg | Freq: Once | ORAL | Status: AC | PRN

## 2014-08-29 MED ORDER — OXYCODONE HCL 5 MG PO TABS: 5.0000 mg | ORAL_TABLET | ORAL | Status: DC | PRN

## 2014-08-29 MED ORDER — PROPOFOL 10 MG/ML IV BOLUS
INTRAVENOUS | Status: AC
  Filled 2014-08-29: qty 20

## 2014-08-29 MED ORDER — NEOSTIGMINE METHYLSULFATE 10 MG/10ML IV SOLN
INTRAVENOUS | Status: AC
  Filled 2014-08-29: qty 3

## 2014-08-29 MED ORDER — MIDAZOLAM HCL 5 MG/5ML IJ SOLN
INTRAMUSCULAR | Status: DC | PRN
  Administered 2014-08-29: 2 mg via INTRAVENOUS

## 2014-08-29 MED ORDER — HYDROMORPHONE HCL 1 MG/ML IJ SOLN
0.2500 mg | INTRAMUSCULAR | Status: DC | PRN
  Administered 2014-08-29 (×3): 0.5 mg via INTRAVENOUS

## 2014-08-29 MED ORDER — OXYCODONE HCL 5 MG PO TABS
ORAL_TABLET | ORAL | Status: AC
  Filled 2014-08-29: qty 1

## 2014-08-29 MED ORDER — NEOSTIGMINE METHYLSULFATE 10 MG/10ML IV SOLN
INTRAVENOUS | Status: DC | PRN
  Administered 2014-08-29: 3 mg via INTRAVENOUS

## 2014-08-29 MED ORDER — LACTATED RINGERS IV SOLN
INTRAVENOUS | Status: DC | PRN
  Administered 2014-08-29 (×2): via INTRAVENOUS

## 2014-08-29 MED ORDER — ARTIFICIAL TEARS OP OINT
TOPICAL_OINTMENT | OPHTHALMIC | Status: AC
  Filled 2014-08-29: qty 3.5

## 2014-08-29 MED ORDER — BUPIVACAINE-EPINEPHRINE 0.25% -1:200000 IJ SOLN
INTRAMUSCULAR | Status: DC | PRN
  Administered 2014-08-29: 22 mL

## 2014-08-29 MED ORDER — SODIUM CHLORIDE 0.9 % IV SOLN
INTRAVENOUS | Status: DC | PRN
  Administered 2014-08-29: 3 mL

## 2014-08-29 MED ORDER — BUPIVACAINE-EPINEPHRINE (PF) 0.25% -1:200000 IJ SOLN
INTRAMUSCULAR | Status: AC
  Filled 2014-08-29: qty 30

## 2014-08-29 MED ORDER — PROPOFOL 10 MG/ML IV BOLUS
INTRAVENOUS | Status: DC | PRN
  Administered 2014-08-29: 180 mg via INTRAVENOUS

## 2014-08-29 MED ORDER — SODIUM CHLORIDE 0.9 % IR SOLN
Status: DC | PRN
  Administered 2014-08-29 (×3): 1000 mL

## 2014-08-29 MED ORDER — FENTANYL CITRATE 0.05 MG/ML IJ SOLN
INTRAMUSCULAR | Status: AC
  Filled 2014-08-29: qty 5

## 2014-08-29 MED ORDER — GLYCOPYRROLATE 0.2 MG/ML IJ SOLN
INTRAMUSCULAR | Status: DC | PRN
  Administered 2014-08-29: .4 mg via INTRAVENOUS

## 2014-08-29 MED ORDER — GLYCOPYRROLATE 0.2 MG/ML IJ SOLN
INTRAMUSCULAR | Status: AC
  Filled 2014-08-29: qty 2

## 2014-08-29 MED ORDER — HYDROMORPHONE HCL 1 MG/ML IJ SOLN
INTRAMUSCULAR | Status: AC
  Administered 2014-08-29: 0.5 mg via INTRAVENOUS
  Filled 2014-08-29: qty 1

## 2014-08-29 MED ORDER — ROCURONIUM BROMIDE 100 MG/10ML IV SOLN
INTRAVENOUS | Status: DC | PRN
  Administered 2014-08-29: 50 mg via INTRAVENOUS
  Administered 2014-08-29: 5 mg via INTRAVENOUS

## 2014-08-29 MED ORDER — MIDAZOLAM HCL 2 MG/2ML IJ SOLN
INTRAMUSCULAR | Status: AC
Start: 2014-08-29 — End: 2014-08-29
  Filled 2014-08-29: qty 2

## 2014-08-29 MED ORDER — EPHEDRINE SULFATE 50 MG/ML IJ SOLN
INTRAMUSCULAR | Status: DC | PRN
  Administered 2014-08-29: 5 mg via INTRAVENOUS

## 2014-08-29 MED ORDER — OXYCODONE HCL 5 MG PO TABS
5.0000 mg | ORAL_TABLET | Freq: Once | ORAL | Status: AC | PRN
  Administered 2014-08-29: 5 mg via ORAL

## 2014-08-29 MED ORDER — ONDANSETRON HCL 4 MG/2ML IJ SOLN
INTRAMUSCULAR | Status: AC
  Filled 2014-08-29: qty 4

## 2014-08-29 MED ORDER — ROCURONIUM BROMIDE 100 MG/10ML IV SOLN: INTRAVENOUS | Status: DC | PRN

## 2014-08-29 MED ORDER — ONDANSETRON HCL 4 MG/2ML IJ SOLN
INTRAMUSCULAR | Status: DC | PRN
  Administered 2014-08-29: 4 mg via INTRAVENOUS

## 2014-08-29 MED ORDER — HYDROMORPHONE HCL 1 MG/ML IJ SOLN
INTRAMUSCULAR | Status: AC
  Filled 2014-08-29: qty 1

## 2014-08-29 MED ORDER — HEMOSTATIC AGENTS (NO CHARGE) OPTIME
TOPICAL | Status: DC | PRN
  Administered 2014-08-29: 1 via TOPICAL

## 2014-08-29 MED ORDER — OXYCODONE HCL 5 MG PO TABS: 5.0000 mg | ORAL_TABLET | Freq: Four times a day (QID) | ORAL | Status: DC | PRN

## 2014-08-29 MED ORDER — LIDOCAINE HCL (CARDIAC) 20 MG/ML IV SOLN
INTRAVENOUS | Status: AC
  Filled 2014-08-29: qty 10

## 2014-08-29 MED ORDER — PHENYLEPHRINE 40 MCG/ML (10ML) SYRINGE FOR IV PUSH (FOR BLOOD PRESSURE SUPPORT)
PREFILLED_SYRINGE | INTRAVENOUS | Status: AC
  Filled 2014-08-29: qty 20

## 2014-08-29 MED ORDER — 0.9 % SODIUM CHLORIDE (POUR BTL) OPTIME
TOPICAL | Status: DC | PRN
  Administered 2014-08-29: 1000 mL

## 2014-08-29 SURGICAL SUPPLY — 46 items
APPLIER CLIP 5 13 M/L LIGAMAX5 (MISCELLANEOUS) ×2
BLADE SURG ROTATE 9660 (MISCELLANEOUS) IMPLANT
CANISTER SUCTION 2500CC (MISCELLANEOUS) ×2 IMPLANT
CHLORAPREP W/TINT 26ML (MISCELLANEOUS) ×2 IMPLANT
CLIP APPLIE 5 13 M/L LIGAMAX5 (MISCELLANEOUS) ×1 IMPLANT
COVER MAYO STAND STRL (DRAPES) ×2 IMPLANT
COVER SURGICAL LIGHT HANDLE (MISCELLANEOUS) ×2 IMPLANT
DRAPE C-ARM 42X72 X-RAY (DRAPES) ×2 IMPLANT
DRAPE LAPAROSCOPIC ABDOMINAL (DRAPES) ×2 IMPLANT
ELECT REM PT RETURN 9FT ADLT (ELECTROSURGICAL) ×2
ELECTRODE REM PT RTRN 9FT ADLT (ELECTROSURGICAL) ×1 IMPLANT
FILTER SMOKE EVAC LAPAROSHD (FILTER) IMPLANT
GLOVE BIO SURGEON STRL SZ 6.5 (GLOVE) ×2 IMPLANT
GLOVE BIO SURGEON STRL SZ8 (GLOVE) ×2 IMPLANT
GLOVE BIOGEL PI IND STRL 6 (GLOVE) ×3 IMPLANT
GLOVE BIOGEL PI IND STRL 7.0 (GLOVE) ×1 IMPLANT
GLOVE BIOGEL PI IND STRL 8 (GLOVE) ×1 IMPLANT
GLOVE BIOGEL PI INDICATOR 6 (GLOVE) ×3
GLOVE BIOGEL PI INDICATOR 7.0 (GLOVE) ×1
GLOVE BIOGEL PI INDICATOR 8 (GLOVE) ×1
GLOVE SURG SS PI 7.0 STRL IVOR (GLOVE) ×2 IMPLANT
GOWN STRL REUS W/ TWL LRG LVL3 (GOWN DISPOSABLE) ×2 IMPLANT
GOWN STRL REUS W/ TWL XL LVL3 (GOWN DISPOSABLE) ×1 IMPLANT
GOWN STRL REUS W/TWL LRG LVL3 (GOWN DISPOSABLE) ×2
GOWN STRL REUS W/TWL XL LVL3 (GOWN DISPOSABLE) ×1
HEMOSTAT SNOW SURGICEL 2X4 (HEMOSTASIS) ×2 IMPLANT
KIT BASIN OR (CUSTOM PROCEDURE TRAY) ×2 IMPLANT
KIT ROOM TURNOVER OR (KITS) ×2 IMPLANT
LIQUID BAND (GAUZE/BANDAGES/DRESSINGS) ×2 IMPLANT
NS IRRIG 1000ML POUR BTL (IV SOLUTION) ×2 IMPLANT
PAD ARMBOARD 7.5X6 YLW CONV (MISCELLANEOUS) ×2 IMPLANT
POUCH RETRIEVAL ECOSAC 10 (ENDOMECHANICALS) ×1 IMPLANT
POUCH RETRIEVAL ECOSAC 10MM (ENDOMECHANICALS) ×1
SCISSORS LAP 5X35 DISP (ENDOMECHANICALS) ×2 IMPLANT
SET CHOLANGIOGRAPH 5 50 .035 (SET/KITS/TRAYS/PACK) ×2 IMPLANT
SET IRRIG TUBING LAPAROSCOPIC (IRRIGATION / IRRIGATOR) ×2 IMPLANT
SLEEVE ENDOPATH XCEL 5M (ENDOMECHANICALS) ×4 IMPLANT
SPECIMEN JAR MEDIUM (MISCELLANEOUS) ×2 IMPLANT
SUT VIC AB 4-0 PS2 27 (SUTURE) ×2 IMPLANT
SUT VICRYL 0 UR6 27IN ABS (SUTURE) ×2 IMPLANT
TOWEL OR 17X24 6PK STRL BLUE (TOWEL DISPOSABLE) ×2 IMPLANT
TOWEL OR 17X26 10 PK STRL BLUE (TOWEL DISPOSABLE) ×2 IMPLANT
TRAY LAPAROSCOPIC (CUSTOM PROCEDURE TRAY) ×2 IMPLANT
TROCAR XCEL BLUNT TIP 100MML (ENDOMECHANICALS) ×2 IMPLANT
TROCAR XCEL NON-BLD 5MMX100MML (ENDOMECHANICALS) ×2 IMPLANT
TUBING INSUFFLATION (TUBING) ×2 IMPLANT

## 2014-08-29 NOTE — Anesthesia Preprocedure Evaluation (Addendum)
Anesthesia Evaluation  Patient identified by MRN, date of birth, ID band Patient awake    Reviewed: Allergy & Precautions, H&P , NPO status , Patient's Chart, lab work & pertinent test results  History of Anesthesia Complications Negative for: history of anesthetic complications  Airway Mallampati: II  TM Distance: >3 FB     Dental  (+) Teeth Intact, Dental Advisory Given   Pulmonary neg pulmonary ROS,  breath sounds clear to auscultation        Cardiovascular negative cardio ROS  Rhythm:Regular     Neuro/Psych PSYCHIATRIC DISORDERS Anxiety negative neurological ROS     GI/Hepatic negative GI ROS, Neg liver ROS,   Endo/Other  negative endocrine ROS  Renal/GU negative Renal ROS     Musculoskeletal   Abdominal   Peds  Hematology negative hematology ROS (+)   Anesthesia Other Findings   Reproductive/Obstetrics                            Anesthesia Physical Anesthesia Plan  ASA: II  Anesthesia Plan: General   Post-op Pain Management:    Induction: Intravenous  Airway Management Planned: Oral ETT  Additional Equipment: None  Intra-op Plan:   Post-operative Plan: Extubation in OR  Informed Consent: I have reviewed the patients History and Physical, chart, labs and discussed the procedure including the risks, benefits and alternatives for the proposed anesthesia with the patient or authorized representative who has indicated his/her understanding and acceptance.   Dental advisory given  Plan Discussed with: CRNA and Surgeon  Anesthesia Plan Comments:         Anesthesia Quick Evaluation

## 2014-08-29 NOTE — H&P (Signed)
History of Present Illness [name] [name]([name] Shupert E. [name] MD; 08/08/2014 10:57 AM) Patient words: gallstones.  The patient is a 38 year old male who presents for evaluation of gallbladder disease. Patient has had episodic right upper quadrant pain extending to his back for several years, possibly up to 10 years. He adjusted his diet and avoided gluten but is still having difficulties. He had a bad attack one month ago and he went for evaluation. X-rays revealed gallstones. He has been avoiding fatty foods and is only had small episodes of pain since that time. I was asked to see him in consultation by Dr. Yetta BarreJones to consider cholecystectomy.   Other Problems [name] [name]([name] [name], CMA; 08/08/2014 10:44 AM) Anxiety Disorder Back Pain Depression  Past Surgical History [name] [name]([name] [name], CMA; 08/08/2014 10:44 AM) Colon Polyp Removal - Colonoscopy  Diagnostic Studies History [name] [name]([name] [name], CMA; 08/08/2014 10:44 AM) Colonoscopy 1-5 years ago  Allergies [name] [name]([name] [name], CMA; 08/08/2014 10:45 AM) No Known Drug Allergies12/05/2014  Medication History ([name] [name], CMA; 08/08/2014 10:47 AM) KlonoPIN (1MG  Tablet, Oral) Active. Propranolol HCl (20MG  Tablet, Oral) Active. Sertraline HCl (50MG  Tablet, Oral) Active. Finasteride (1MG  Tablet, Oral) Active. Aciphex (20MG  Tablet DR, Oral) Active.  Social History [name] [name]([name] [name], CMA; 08/08/2014 10:44 AM) Alcohol use Occasional alcohol use. Caffeine use Tea. No drug use Tobacco use Never smoker.  Family History [name] [name]([name] [name], CMA; 08/08/2014 10:44 AM) Breast Cancer Mother. Colon Cancer Mother. Diabetes Mellitus Father. Heart Disease Brother. Heart disease in male family member before age 40 Heart disease in male family member before age 355  Review of Systems [name] [name]([name] [name] CMA; 08/08/2014 10:44 AM) General Present- Appetite Loss, Chills and Fatigue. Not Present- Fever, Night Sweats, Weight Gain and Weight Loss. Skin Not Present- Change in Wart/Mole, Dryness,  Hives, Jaundice, New Lesions, Non-Healing Wounds, Rash and Ulcer. HEENT Present- Wears glasses/contact lenses. Not Present- Earache, Hearing Loss, Hoarseness, Nose Bleed, Oral Ulcers, Ringing in the Ears, Seasonal Allergies, Sinus Pain, Sore Throat, Visual Disturbances and Yellow Eyes. Respiratory Not Present- Bloody sputum, Chronic Cough, Difficulty Breathing, Snoring and Wheezing. Breast Not Present- Breast Mass, Breast Pain, Nipple Discharge and Skin Changes. Cardiovascular Not Present- Chest Pain, Difficulty Breathing Lying Down, Leg Cramps, Palpitations, Rapid Heart Rate, Shortness of Breath and Swelling of Extremities. Gastrointestinal Present- Abdominal Pain, Bloating, Gets full quickly at meals, Nausea and Vomiting. Not Present- Bloody Stool, Change in Bowel Habits, Chronic diarrhea, Constipation, Difficulty Swallowing, Excessive gas, Hemorrhoids, Indigestion and Rectal Pain. Male Genitourinary Not Present- Blood in Urine, Change in Urinary Stream, Frequency, Impotence, Nocturia, Painful Urination, Urgency and Urine Leakage. Musculoskeletal Present- Back Pain. Not Present- Joint Pain, Joint Stiffness, Muscle Pain, Muscle Weakness and Swelling of Extremities. Psychiatric Not Present- Anxiety, Bipolar, Change in Sleep Pattern, Depression, Fearful and Frequent crying. Hematology Not Present- Easy Bruising, Excessive bleeding, Gland problems, HIV and Persistent Infections.   Vitals ([name] [name] CMA; 08/08/2014 10:45 AM) 08/08/2014 10:45 AM Weight: 185 lb Height: 67in Body Surface Area: 1.99 m Body Mass Index: 28.97 kg/m Temp.: 82F(Temporal)  Pulse: 76 (Regular)  BP: 124/80 (Sitting, Left Arm, Standard)    Physical Exam [name] [name](Sharnika Binney E. [name] MD; 08/08/2014 10:58 AM) General Mental Status-Alert. General Appearance-Consistent with stated age. Hydration-Well hydrated. Voice-Normal.  Integumentary Note: Small hypopigmented patches on trunk consistent with tinea  versicolor   Head and Neck Head-normocephalic, atraumatic with no lesions or palpable masses.  Eye Eyeball - Bilateral-Extraocular movements intact. Sclera/Conjunctiva - Bilateral-No scleral icterus.  Chest and Lung Exam Chest and lung exam reveals -quiet, even and easy respiratory effort  with no use of accessory muscles and on auscultation, normal breath sounds, no adventitious sounds and normal vocal resonance. Inspection Chest Wall - Normal. Back - normal.  Cardiovascular Cardiovascular examination reveals -on palpation PMI is normal in location and amplitude, no palpable S3 or S4. Normal cardiac borders., normal heart sounds, regular rate and rhythm with no murmurs, carotid auscultation reveals no bruits and normal pedal pulses bilaterally.  Abdomen Inspection Inspection of the abdomen reveals - No Hernias. Skin - Scar - no surgical scars. Palpation/Percussion Palpation and Percussion of the abdomen reveal - Soft, Non Tender, No Rebound tenderness, No Rigidity (guarding) and No hepatosplenomegaly. Auscultation Auscultation of the abdomen reveals - Bowel sounds normal.  Neurologic Neurologic evaluation reveals -alert and oriented x 3 with no impairment of recent or remote memory. Mental Status-Normal.  Musculoskeletal Normal Exam - Left-Upper Extremity Strength Normal and Lower Extremity Strength Normal. Normal Exam - Right-Upper Extremity Strength Normal, Lower Extremity Weakness.    Assessment & Plan [name] [name](Vernita Tague E. [name] MD; 08/08/2014 10:59 AM) CHOLELITHIASIS (574.20  K80.20) Impression: I have offered laparoscopic cholecystectomy with cholangiogram. Procedure, risks, and benefits were discussed in detail. He is agreeable and looking forward to schedule before the end of the year. Our schedulers will contact him to set up a time. I advised him to avoid fatty foods in the interim.

## 2014-08-29 NOTE — Progress Notes (Signed)
Patient informed Nurse that Propranolol would need to be ordered since he did take it. Patient stated "I don't need the propanolol and I do not take it everyday....Marland Kitchen.Marland Kitchen.I take it for anxiety and I do not need it!" Nurse asked patient if he takes it everyday and he stated "it was prescribed for me to take it everyday, but I don't." Will inform assigned Anesthesiologist of this.

## 2014-08-29 NOTE — Anesthesia Procedure Notes (Signed)
Procedure Name: Intubation Date/Time: 08/29/2014 8:44 AM Performed by: Minus LibertyAVENEL, Rasheena Talmadge Pre-anesthesia Checklist: Patient identified, Timeout performed, Emergency Drugs available, Suction available and Patient being monitored Patient Re-evaluated:Patient Re-evaluated prior to inductionOxygen Delivery Method: Circle system utilized Preoxygenation: Pre-oxygenation with 100% oxygen Intubation Type: IV induction Ventilation: Mask ventilation without difficulty Laryngoscope Size: Mac and 4 Grade View: Grade II Tube type: Oral Tube size: 7.5 mm Number of attempts: 1 Airway Equipment and Method: Stylet Placement Confirmation: ETT inserted through vocal cords under direct vision,  breath sounds checked- equal and bilateral,  positive ETCO2 and CO2 detector Secured at: 22 cm Tube secured with: Tape Dental Injury: Teeth and Oropharynx as per pre-operative assessment

## 2014-08-29 NOTE — Interval H&P Note (Signed)
History and Physical Interval Note:  08/29/2014 7:39 AM  [name] [name]  has presented today for surgery, with the diagnosis of symptomatic cholelithiasis  The various methods of treatment have been discussed with the patient and family. After consideration of risks, benefits and other options for treatment, the patient has consented to  Procedure(s): LAPAROSCOPIC CHOLECYSTECTOMY WITH INTRAOPERATIVE CHOLANGIOGRAM (N/A) as a surgical intervention .  The patient's history has been reviewed, patient re-examined, no change in status, stable for surgery.  I have reviewed the patient's chart and labs.  Questions were answered to the patient's satisfaction.     [name]

## 2014-08-29 NOTE — Discharge Instructions (Signed)
What to eat: ° °For your first meals, you should eat lightly; only small meals initially.  If you do not have nausea, you may eat larger meals.  Avoid spicy, greasy and heavy food.   ° °General Anesthesia, Adult, Care After  °Refer to this sheet in the next few weeks. These instructions provide you with information on caring for yourself after your procedure. Your health care provider may also give you more specific instructions. Your treatment has been planned according to current medical practices, but problems sometimes occur. Call your health care provider if you have any problems or questions after your procedure.  °WHAT TO EXPECT AFTER THE PROCEDURE  °After the procedure, it is typical to experience:  °Sleepiness.  °Nausea and vomiting. °HOME CARE INSTRUCTIONS  °For the first 24 hours after general anesthesia:  °Have a responsible person with you.  °Do not drive a car. If you are alone, do not take public transportation.  °Do not drink alcohol.  °Do not take medicine that has not been prescribed by your health care provider.  °Do not sign important papers or make important decisions.  °You may resume a normal diet and activities as directed by your health care provider.  °Change bandages (dressings) as directed.  °If you have questions or problems that seem related to general anesthesia, call the hospital and ask for the anesthetist or anesthesiologist on call. °SEEK MEDICAL CARE IF:  °You have nausea and vomiting that continue the day after anesthesia.  °You develop a rash. °SEEK IMMEDIATE MEDICAL CARE IF:  °You have difficulty breathing.  °You have chest pain.  °You have any allergic problems. °Document Released: 11/23/2000 Document Revised: 04/19/2013 Document Reviewed: 03/02/2013  °ExitCare® Patient Information ©2014 [employer].  ° ° °Laceration Care, Adult  ° ° °A laceration is a cut that goes through all layers of the skin. The cut goes into the tissue beneath the skin.  °HOME CARE  °For stitches  (sutures) or staples:  °Keep the cut clean and dry.  °If you have a bandage (dressing), change it at least once a day. Change the bandage if it gets wet or dirty, or as told by your doctor.  °Wash the cut with soap and water 2 times a day. Rinse the cut with water. Pat it dry with a clean towel.  °Put a thin layer of medicated cream on the cut as told by your doctor.  °You may shower after the first 24 hours. Do not soak the cut in water until the stitches are removed.  °Only take medicines as told by your doctor.  °Have your stitches or staples removed as told by your doctor. °For skin adhesive strips:  °Keep the cut clean and dry.  °Do not get the strips wet. You may take a bath, but be careful to keep the cut dry.  °If the cut gets wet, pat it dry with a clean towel.  °The strips will fall off on their own. Do not remove the strips that are still stuck to the cut. °For wound glue:  °You may shower or take baths. Do not soak or scrub the cut. Do not swim. Avoid heavy sweating until the glue falls off on its own. After a shower or bath, pat the cut dry with a clean towel.  °Do not put medicine on your cut until the glue falls off.  °If you have a bandage, do not put tape over the glue.  °Avoid lots of sunlight or tanning lamps until   the glue falls off. Put sunscreen on the cut for the first year to reduce your scar.  °The glue will fall off on its own. Do not pick at the glue. °You may need a tetanus shot if:  °You cannot remember when you had your last tetanus shot.  °You have never had a tetanus shot. °If you need a tetanus shot and you choose not to have one, you may get tetanus. Sickness from tetanus can be serious.  °GET HELP RIGHT AWAY IF:  °Your pain does not get better with medicine.  °Your arm, hand, leg, or foot loses feeling (numbness) or changes color.  °Your cut is bleeding.  °Your joint feels weak, or you cannot use your joint.  °You have painful lumps on your body.  °Your cut is red, puffy (swollen),  or painful.  °You have a red line on the skin near the cut.  °You have yellowish-white fluid (pus) coming from the cut.  °You have a fever.  °You have a bad smell coming from the cut or bandage.  °Your cut breaks open before or after stitches are removed.  °You notice something coming out of the cut, such as wood or glass.  °You cannot move a finger or toe. °MAKE SURE YOU:  °Understand these instructions.  °Will watch your condition.  °Will get help right away if you are not doing well or get worse. °Document Released: 02/03/2008 Document Revised: 11/09/2011 Document Reviewed: 02/10/2011  °ExitCare® Patient Information ©2014 [employer].  ° ° °

## 2014-08-29 NOTE — Op Note (Addendum)
08/29/2014  10:47 AM  PATIENT:  [name] [name]  38 y.o. male  PRE-OPERATIVE DIAGNOSIS:  Symptomatic cholelithiasis  POST-OPERATIVE DIAGNOSIS:  Severe chronic cholecystitis  PROCEDURE:  Procedure(s): LAPAROSCOPIC CHOLECYSTECTOMY WITH INTRAOPERATIVE CHOLANGIOGRAM  SURGEON:  Surgeon(s): Violeta GelinasBurke Trevonn Hallum, MD  ASSISTANTS: Sherrie Georgehastity Yates, RNFA  ANESTHESIA:   local and general  EBL:  Total I/O In: 1000 [I.V.:1000] Out: 20 [Blood:20]  BLOOD ADMINISTERED:none  DRAINS: none   SPECIMEN:  Excision  DISPOSITION OF SPECIMEN:  PATHOLOGY  COUNTS:  YES  DICTATION: Reubin Milan.Dragon Dictation patient presents for cholecystectomy. He was identified in the preop holding area. Informed consent was obtained. He received intravenous antibiotics. He was brought to the operating room and general endotracheal anesthesia was administered by the anesthesia staff. His abdomen was prepped and draped in a sterile fashion. We did a time out procedure. Infra-umbilical region was infiltrated with local. Infra-umbilical incision was made. Subcutaneous tissues were dissected down revealing the anterior fascia. This was divided sharply along the midline & peritoneal cavity was entered under direct vision. 0 Vicryl pursestring was placed on the fascial opening in the [facility] trocar was inserted. Abdomen was insufflated with carbon dioxide in standard fashion. Under direct vision, a 5 mm epigastric and 25 mm right lateral ports were placed. Local was used at each port site. Laparoscopic exploration revealed Evidence of significant chronic cholecystitis with adherent omentum to the gallbladder. It was densely adherent. The dome of the gallbladder was cleared and identified allowing retraction superiorly medially. Gradual slow dissection was able to expose the gallbladder. Hemostasis was achieved along the way on the omentum with cautery. The body and eventually infundibulum were exposed. This was a tedious dissection but progressed  well. The infundibulum contained a large gallstone, proximally golfball size. Dissection again laterally along the inferior aspect of the infundibulum and progressed medially easily identifying the cystic duct which had good length and was narrow. Dissection continued until critical view was made between this, the liver, and the infundibulum. Clip was placed on the infundibular cystic duct junction. Small nick was made in the cystic duct. Cholangiogram catheter was inserted. Intraoperative cholangiogram demonstrated no common bile duct filling defects and good flow of contrast into the duodenum. Cholangiogram catheter was removed. 3 clips were placed proximally on the cystic duct and it was divided. Further dissection revealed an anterior and posterior branch of the cystic artery. Each were clipped twice proximally and divided distally. Gallbladder was taken off the liver bed with Bovie cautery achieving good hemostasis. Gallbladder was placed in an Eco sac and removed from the abdomen via the infraumbilical port site. I did have to enlarge the fascia defect at that location. Next liver bed was copiously irrigated and cautery was used to get excellent hemostasis. Clips were checked and were in good position we copiously irrigated the abdomen with 2 L of saline and this returned clear. Liver bed was rechecked and a piece of Surgicel snow was placed and appeared dry. Irrigation fluid was evacuated. Ports were removed under direct vision. Pneumoperitoneum was released. All wounds were irrigated. Informed local fascia was closed with 2 separate figure-of-eight 0 Vicryl sutures achieving an excellent closure. Skin of each wound was closed with running 4-0 Vicryl subcuticular followed by Dermabond. All counts were correct. Patient tolerated the procedure well without apparent complication and was taken recovery in stable condition. PATIENT DISPOSITION:  PACU - hemodynamically stable.   Delay start of Pharmacological  VTE agent (>24hrs) due to surgical blood loss or risk of bleeding:  no  Violeta GelinasBurke [name], MD, MPH, FACS Pager: [phone]  12/30/201510:47 AM

## 2014-08-29 NOTE — Transfer of Care (Signed)
Immediate Anesthesia Transfer of Care Note  Patient: [name] [name]  Procedure(s) Performed: Procedure(s): LAPAROSCOPIC CHOLECYSTECTOMY WITH INTRAOPERATIVE CHOLANGIOGRAM (N/A)  Patient Location: PACU  Anesthesia Type:General  Level of Consciousness: awake, alert  and oriented  Airway & Oxygen Therapy: Patient Spontanous Breathing  Post-op Assessment: Report given to PACU RN and Post -op Vital signs reviewed and stable  Post vital signs: Reviewed and stable  Complications: No apparent anesthesia complications

## 2014-08-29 NOTE — Telephone Encounter (Signed)
Pt wife called in stating he had gallbladder surgery today and when they got home he started having back pain with spasms. It is worse when he lays down. It is keeping him from sleep and rest. Advised for him to try and lay in a reclining position to see if this helps with the spasms for now. Will Let Dr [name] know and see if he has any other recommendations.

## 2014-08-30 ENCOUNTER — Encounter (HOSPITAL_COMMUNITY): Payer: Self-pay | Admitting: General Surgery

## 2014-08-30 ENCOUNTER — Other Ambulatory Visit (INDEPENDENT_AMBULATORY_CARE_PROVIDER_SITE_OTHER): Payer: Self-pay | Admitting: General Surgery

## 2014-08-30 MED ORDER — TIZANIDINE HCL 4 MG PO TABS: 4.0000 mg | ORAL_TABLET | Freq: Three times a day (TID) | ORAL | Status: DC | PRN

## 2014-08-30 NOTE — Telephone Encounter (Signed)
Please call him and tell him I sent a prescription for a MM relaxer (zanaflex) to his pharmacy. Thx and happy new year!

## 2014-08-30 NOTE — Telephone Encounter (Signed)
LMOM to call nursing triage to inform him of Dr Carollee Massedhompson's message

## 2014-08-30 NOTE — Anesthesia Postprocedure Evaluation (Signed)
  Anesthesia Post-op Note  Patient: [name] [name]  Procedure(s) Performed: Procedure(s): LAPAROSCOPIC CHOLECYSTECTOMY WITH INTRAOPERATIVE CHOLANGIOGRAM (N/A)  Patient Location: PACU  Anesthesia Type:General  Level of Consciousness: awake  Airway and Oxygen Therapy: Patient Spontanous Breathing  Post-op Pain: mild  Post-op Assessment: Post-op Vital signs reviewed, Patient's Cardiovascular Status Stable, Respiratory Function Stable, Patent Airway, No signs of Nausea or vomiting and Pain level controlled  Post-op Vital Signs: Reviewed and stable  Last Vitals:  Filed Vitals:   08/29/14 1315  BP: 136/88  Pulse: 70  Temp:   Resp:     Complications: No apparent anesthesia complications

## 2014-09-04 ENCOUNTER — Other Ambulatory Visit: Payer: Self-pay | Admitting: Internal Medicine

## 2014-12-25 ENCOUNTER — Other Ambulatory Visit: Payer: Self-pay | Admitting: Internal Medicine

## 2015-03-14 ENCOUNTER — Encounter: Payer: Self-pay | Admitting: Gastroenterology

## 2015-03-28 ENCOUNTER — Telehealth: Payer: Self-pay | Admitting: Internal Medicine

## 2015-03-28 MED ORDER — CLONAZEPAM 1 MG PO TABS: 1.0000 mg | ORAL_TABLET | Freq: Two times a day (BID) | ORAL | Status: DC | PRN

## 2015-03-28 NOTE — Telephone Encounter (Signed)
Done

## 2015-03-28 NOTE — Telephone Encounter (Signed)
Patient requesting refill for clonazePAM (KLONOPIN) 1 MG tablet [[id number]

## 2015-07-11 ENCOUNTER — Encounter: Payer: Self-pay | Admitting: Gastroenterology

## 2015-08-28 ENCOUNTER — Encounter: Payer: Self-pay | Admitting: Gastroenterology

## 2015-09-01 HISTORY — PX: COLONOSCOPY: SHX174

## 2015-10-08 ENCOUNTER — Ambulatory Visit (AMBULATORY_SURGERY_CENTER): Payer: Self-pay

## 2015-10-08 VITALS — Ht 70.0 in | Wt 204.2 lb

## 2015-10-08 DIAGNOSIS — Z86010 Personal history of colonic polyps: Secondary | ICD-10-CM

## 2015-10-08 DIAGNOSIS — Z800 Family history of malignant neoplasm of digestive organs: Secondary | ICD-10-CM

## 2015-10-08 MED ORDER — NA SULFATE-K SULFATE-MG SULF 17.5-3.13-1.6 GM/177ML PO SOLN
ORAL | Status: DC
Start: 2015-10-08 — End: 2015-10-21

## 2015-10-08 NOTE — Progress Notes (Signed)
Per pt, no allergies to soy or egg products.Pt not taking any weight loss meds or using  O2 at home. 

## 2015-10-21 ENCOUNTER — Encounter: Payer: Self-pay | Admitting: Gastroenterology

## 2015-10-21 ENCOUNTER — Ambulatory Visit (AMBULATORY_SURGERY_CENTER): Payer: BLUE CROSS/BLUE SHIELD | Admitting: Gastroenterology

## 2015-10-21 VITALS — BP 120/68 | HR 82 | Temp 98.4°F | Resp 20 | Ht 70.0 in | Wt 204.0 lb

## 2015-10-21 DIAGNOSIS — Z800 Family history of malignant neoplasm of digestive organs: Secondary | ICD-10-CM

## 2015-10-21 DIAGNOSIS — Z86010 Personal history of colonic polyps: Secondary | ICD-10-CM | POA: Diagnosis not present

## 2015-10-21 LAB — HM COLONOSCOPY

## 2015-10-21 MED ORDER — SODIUM CHLORIDE 0.9 % IV SOLN: 500.0000 mL | INTRAVENOUS | Status: DC

## 2015-10-21 NOTE — Op Note (Signed)
[facility] [address] [place], [address]   COLONOSCOPY PROCEDURE REPORT  PATIENT: [name], [name]  MR#: [id number] BIRTHDATE: 12-Oct-1975 , 39  yrs. old GENDER: male ENDOSCOPIST: Rachael Fee, MD PROCEDURE DATE:  10/21/2015 PROCEDURE:   Colonoscopy, surveillance First Screening Colonoscopy - Avg.  risk and is 50 yrs.  old or older - No.  Prior Negative Screening - Now for repeat screening. N/A  History of Adenoma - Now for follow-up colonoscopy & has been > or = to 3 yrs.  Yes hx of adenoma.  Has been 3 or more years since last colonoscopy.  high risk ASA CLASS:   Class II INDICATIONS:Surveillance due to prior colonic neoplasia and Colonoscopy 2011 Dr.  Christella Hartigan found one small TA, also mother had colon cancer in her 63s. MEDICATIONS: Monitored anesthesia care and Propofol 200 mg IV  DESCRIPTION OF PROCEDURE:   After the risks benefits and alternatives of the procedure were thoroughly explained, informed consent was obtained.  The digital rectal exam revealed no abnormalities of the rectum.   The LB [id number]  endoscope was introduced through the anus and advanced to the cecum, which was identified by both the appendix and ileocecal valve. No adverse events experienced.   The quality of the prep was good.  The instrument was then slowly withdrawn as the colon was fully examined. Estimated blood loss is zero unless otherwise noted in this procedure report.   COLON FINDINGS: A normal appearing cecum, ileocecal valve, and appendiceal orifice were identified.  The ascending, transverse, descending, sigmoid colon, and rectum appeared unremarkable. Retroflexed views revealed no abnormalities. The time to cecum = 2.1 Withdrawal time = 7.1   The scope was withdrawn and the procedure completed. COMPLICATIONS: There were no immediate complications.  ENDOSCOPIC IMPRESSION: Normal colonoscopy No polyps or cancers  RECOMMENDATIONS: Given your significant  family history of colon cancer (mother), you should have a repeat colonoscopy in 5 years  eSigned:  Rachael Fee, MD 10/21/2015 1:58 PM   cc: Sanda Linger, MD

## 2015-10-21 NOTE — Patient Instructions (Signed)
YOU HAD AN ENDOSCOPIC PROCEDURE TODAY AT THE [facility]:   Refer to the procedure report that was given to you for any specific questions about what was found during the examination.  If the procedure report does not answer your questions, please call your gastroenterologist to clarify.  If you requested that your care partner not be given the details of your procedure findings, then the procedure report has been included in a sealed envelope for you to review at your convenience later.  YOU SHOULD EXPECT: Some feelings of bloating in the abdomen. Passage of more gas than usual.  Walking can help get rid of the air that was put into your GI tract during the procedure and reduce the bloating. If you had a lower endoscopy (such as a colonoscopy or flexible sigmoidoscopy) you may notice spotting of blood in your stool or on the toilet paper. If you underwent a bowel prep for your procedure, you may not have a normal bowel movement for a few days.  Please Note:  You might notice some irritation and congestion in your nose or some drainage.  This is from the oxygen used during your procedure.  There is no need for concern and it should clear up in a day or so.  SYMPTOMS TO REPORT IMMEDIATELY:   Following lower endoscopy (colonoscopy or flexible sigmoidoscopy):  Excessive amounts of blood in the stool  Significant tenderness or worsening of abdominal pains  Swelling of the abdomen that is new, acute  Fever of 100F or higher  For urgent or emergent issues, a gastroenterologist can be reached at any hour by calling [phone].   DIET: Your first meal following the procedure should be a small meal and then it is ok to progress to your normal diet. Heavy or fried foods are harder to digest and may make you feel nauseous or bloated.  Likewise, meals heavy in dairy and vegetables can increase bloating.  Drink plenty of fluids but you should avoid alcoholic beverages for 24  hours.  ACTIVITY:  You should plan to take it easy for the rest of today and you should NOT DRIVE or use heavy machinery until tomorrow (because of the sedation medicines used during the test).    FOLLOW UP: Our staff will call the number listed on your records the next business day following your procedure to check on you and address any questions or concerns that you may have regarding the information given to you following your procedure. If we do not reach you, we will leave a message.  However, if you are feeling well and you are not experiencing any problems, there is no need to return our call.  We will assume that you have returned to your regular daily activities without incident.  If any biopsies were taken you will be contacted by phone or by letter within the next 1-3 weeks.  Please call us at [phone] if you have not heard about the biopsies in 3 weeks.    SIGNATURES/CONFIDENTIALITY: You and/or your care partner have signed paperwork which will be entered into your electronic medical record.  These signatures attest to the fact that that the information above on your After Visit Summary has been reviewed and is understood.  Full responsibility of the confidentiality of this discharge information lies with you and/or your care-partner.  Thank you for letting us take care of your healthcare needs.

## 2015-10-21 NOTE — Progress Notes (Signed)
Report to PACU, RN, vss, BBS= Clear.  

## 2015-10-22 ENCOUNTER — Telehealth: Payer: Self-pay | Admitting: *Deleted

## 2015-10-22 NOTE — Telephone Encounter (Signed)
No answer. Left message to call if questions or concerns. 

## 2015-10-23 LAB — HM COLONOSCOPY

## 2016-01-22 ENCOUNTER — Ambulatory Visit (INDEPENDENT_AMBULATORY_CARE_PROVIDER_SITE_OTHER): Payer: BLUE CROSS/BLUE SHIELD | Admitting: Internal Medicine

## 2016-01-22 ENCOUNTER — Encounter: Payer: Self-pay | Admitting: Internal Medicine

## 2016-01-22 ENCOUNTER — Other Ambulatory Visit (INDEPENDENT_AMBULATORY_CARE_PROVIDER_SITE_OTHER): Payer: BLUE CROSS/BLUE SHIELD

## 2016-01-22 VITALS — BP 118/72 | HR 88 | Temp 98.5°F | Resp 16 | Ht 70.0 in | Wt 188.0 lb

## 2016-01-22 DIAGNOSIS — B356 Tinea cruris: Secondary | ICD-10-CM | POA: Diagnosis not present

## 2016-01-22 DIAGNOSIS — R739 Hyperglycemia, unspecified: Secondary | ICD-10-CM

## 2016-01-22 DIAGNOSIS — I10 Essential (primary) hypertension: Secondary | ICD-10-CM | POA: Diagnosis not present

## 2016-01-22 DIAGNOSIS — Z0000 Encounter for general adult medical examination without abnormal findings: Secondary | ICD-10-CM

## 2016-01-22 DIAGNOSIS — L081 Erythrasma: Secondary | ICD-10-CM | POA: Diagnosis not present

## 2016-01-22 LAB — CBC WITH DIFFERENTIAL/PLATELET
BASOS PCT: 0.3 % (ref 0.0–3.0)
Basophils Absolute: 0 10*3/uL (ref 0.0–0.1)
Eosinophils Absolute: 0 10*3/uL (ref 0.0–0.7)
Eosinophils Relative: 0.5 % (ref 0.0–5.0)
HCT: 43.6 % (ref 39.0–52.0)
Hemoglobin: 14.5 g/dL (ref 13.0–17.0)
LYMPHS ABS: 2.9 10*3/uL (ref 0.7–4.0)
Lymphocytes Relative: 30.7 % (ref 12.0–46.0)
MCHC: 33.1 g/dL (ref 30.0–36.0)
MCV: 85.4 fl (ref 78.0–100.0)
MONO ABS: 0.6 10*3/uL (ref 0.1–1.0)
Monocytes Relative: 6.8 % (ref 3.0–12.0)
NEUTROS ABS: 5.8 10*3/uL (ref 1.4–7.7)
NEUTROS PCT: 61.7 % (ref 43.0–77.0)
Platelets: 350 10*3/uL (ref 150.0–400.0)
RBC: 5.11 Mil/uL (ref 4.22–5.81)
RDW: 14.4 % (ref 11.5–15.5)
WBC: 9.4 10*3/uL (ref 4.0–10.5)

## 2016-01-22 LAB — COMPREHENSIVE METABOLIC PANEL
ALK PHOS: 52 U/L (ref 39–117)
ALT: 20 U/L (ref 0–53)
AST: 20 U/L (ref 0–37)
Albumin: 4.8 g/dL (ref 3.5–5.2)
BUN: 12 mg/dL (ref 6–23)
CHLORIDE: 103 meq/L (ref 96–112)
CO2: 29 meq/L (ref 19–32)
Calcium: 9.6 mg/dL (ref 8.4–10.5)
Creatinine, Ser: 0.97 mg/dL (ref 0.40–1.50)
GFR: 90.32 mL/min (ref >60.00)
GLUCOSE: 98 mg/dL (ref 70–99)
Potassium: 3.6 mEq/L (ref 3.5–5.1)
SODIUM: 139 meq/L (ref 135–145)
TOTAL PROTEIN: 7.1 g/dL (ref 6.0–8.3)
Total Bilirubin: 0.6 mg/dL (ref 0.2–1.2)

## 2016-01-22 LAB — LIPID PANEL
CHOL/HDL RATIO: 4
Cholesterol: 197 mg/dL (ref 0–200)
HDL: 51.9 mg/dL (ref >39.00)
LDL CALC: 130 mg/dL — AB (ref 0–99)
NonHDL: 144.95
Triglycerides: 74 mg/dL (ref 0.0–149.0)
VLDL: 14.8 mg/dL (ref 0.0–40.0)

## 2016-01-22 LAB — TSH: TSH: 0.53 u[IU]/mL (ref 0.35–4.50)

## 2016-01-22 LAB — HEMOGLOBIN A1C: Hgb A1c MFr Bld: 5.6 % (ref 4.6–6.5)

## 2016-01-22 MED ORDER — CLINDAMYCIN PHOSPHATE 1 % EX GEL: Freq: Two times a day (BID) | CUTANEOUS | Status: DC

## 2016-01-22 MED ORDER — FINASTERIDE 1 MG PO TABS: 1.0000 mg | ORAL_TABLET | Freq: Every day | ORAL | Status: DC

## 2016-01-22 MED ORDER — TERBINAFINE HCL 250 MG PO TABS: 250.0000 mg | ORAL_TABLET | Freq: Every day | ORAL | Status: AC

## 2016-01-22 NOTE — Progress Notes (Signed)
Subjective:  Patient ID: [name] [name], male    DOB: [birth year]  Age: 40 y.o. MRN: [id number]  CC: Hypertension; Annual Exam; Hyperlipidemia; and Rash   HPI [name] [name] presents for a CPX.  He tells me his blood pressure has been well controlled on the beta blocker, he denies headache, chest pain, shortness of breath, dyspnea on exertion, edema, fatigue, or palpitations.  He complains of an asymptomatic rash in his groin for several months. It is located on both sides of his groin and over his scrotum as well.  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  Outpatient Prescriptions Prior to Visit  Medication Sig Dispense Refill  . TURMERIC PO Take 1,000 mg by mouth daily. Reported on 01/22/2016    . acetaminophen (TYLENOL) 500 MG tablet Take 500 mg by mouth daily. Reported on 01/22/2016    . ibuprofen (ADVIL,MOTRIN) 200 MG tablet Take 200 mg by mouth. Reported on 01/22/2016    . clonazePAM (KLONOPIN) 1 MG tablet Take 1 tablet (1 mg total) by mouth 2 (two) times daily as needed for anxiety. (Patient not taking: Reported on 10/08/2015) 60 tablet 3  . finasteride (PROPECIA) 1 MG tablet TAKE ONE TABLET BY MOUTH ONE TIME DAILY (Patient not taking: Reported on 10/08/2015) 90 tablet 2  . oxyCODONE (OXY IR/ROXICODONE) 5 MG immediate release tablet Take 1-2 tablets (5-10 mg total) by mouth every 6 (six) hours as needed (pain). (Patient not taking: Reported on 10/08/2015) 40 tablet 0  . propranolol (INDERAL) 20 MG tablet Take one tablet by mouth twice daily (Patient not taking: Reported on 10/08/2015) 180 tablet 2  . tiZANidine (ZANAFLEX) 4 MG tablet Take 1 tablet (4 mg total) by mouth every 8 (eight) hours as needed for muscle spasms. (Patient not taking: Reported on 10/08/2015) 30 tablet 1   No facility-administered medications prior to visit.    ROS Review of Systems  Constitutional: Negative.  Negative for fever, chills, diaphoresis, appetite change and fatigue.  HENT: Negative.   Eyes:  Negative.   Respiratory: Negative.  Negative for cough, choking, chest tightness, shortness of breath and stridor.   Cardiovascular: Negative.  Negative for chest pain, palpitations and leg swelling.  Gastrointestinal: Negative.  Negative for nausea, vomiting, abdominal pain, diarrhea and constipation.  Endocrine: Negative.   Genitourinary: Negative.   Musculoskeletal: Negative.   Skin: Positive for color change and rash. Negative for pallor and wound.  Allergic/Immunologic: Negative.   Neurological: Negative.  Negative for dizziness, tremors, syncope, light-headedness and numbness.  Hematological: Negative.  Negative for adenopathy. Does not bruise/bleed easily.  Psychiatric/Behavioral: Negative.     Objective:  BP 118/72 mmHg  Pulse 88  Temp(Src) 98.5 F (36.9 C) (Oral)  Resp 16  Ht  (1.778 m)  Wt 188 lb (85.276 kg)  BMI 26.98 kg/m2  SpO2 98%  BP Readings from Last 3 Encounters:  01/22/16 118/72  10/21/15 120/68  08/29/14 136/88    Wt Readings from Last 3 Encounters:  01/22/16 188 lb (85.276 kg)  10/21/15 204 lb (92.534 kg)  10/08/15 204 lb 3.2 oz (92.625 kg)    Physical Exam  Constitutional: He is oriented to person, place, and time. No distress.  HENT:  Mouth/Throat: Oropharynx is clear and moist. No oropharyngeal exudate.  Eyes: Conjunctivae are normal. Right eye exhibits no discharge. Left eye exhibits no discharge. No scleral icterus.  Neck: Normal range of motion. Neck supple. No JVD present. No tracheal deviation present. No thyromegaly present.  Cardiovascular: Normal rate,  regular rhythm, normal heart sounds and intact distal pulses.  Exam reveals no gallop and no friction rub.   No murmur heard. Pulmonary/Chest: Effort normal and breath sounds normal. No stridor. No respiratory distress. He has no wheezes. He has no rales. He exhibits no tenderness.  Abdominal: Soft. Bowel sounds are normal. He exhibits no distension and no mass. There is no tenderness.  There is no rebound and no guarding. Hernia confirmed negative in the right inguinal area and confirmed negative in the left inguinal area.  Genitourinary: Testes normal and penis normal.    Right testis shows no mass, no swelling and no tenderness. Right testis is descended. Left testis shows no mass, no swelling and no tenderness. Left testis is descended. Circumcised. No penile erythema or penile tenderness. No discharge found.  In the intertriginous region, over the suprapubic region, and over the scrotum, there are confluent areas of flat, tannish, discoloration of the skin. There is no erythema, scale, exudate, crusting, or vesicles.  Musculoskeletal: Normal range of motion. He exhibits no edema or tenderness.  Lymphadenopathy:    He has no cervical adenopathy.       Right: No inguinal adenopathy present.       Left: No inguinal adenopathy present.  Neurological: He is oriented to person, place, and time.  Skin: Skin is warm and dry. Rash noted. He is not diaphoretic. No erythema. No pallor.  Psychiatric: He has a normal mood and affect. His behavior is normal. Judgment and thought content normal.  Vitals reviewed.   Lab Results  Component Value Date   WBC 9.4 01/22/2016   HGB 14.5 01/22/2016   HCT 43.6 01/22/2016   PLT 350.0 01/22/2016   GLUCOSE 98 01/22/2016   CHOL 197 01/22/2016   TRIG 74.0 01/22/2016   HDL 51.90 01/22/2016   LDLDIRECT 158.0 09/09/2012   LDLCALC 130* 01/22/2016   ALT 20 01/22/2016   AST 20 01/22/2016   NA 139 01/22/2016   K 3.6 01/22/2016   CL 103 01/22/2016   CREATININE 0.97 01/22/2016   BUN 12 01/22/2016   CO2 29 01/22/2016   TSH 0.53 01/22/2016   PSA 0.97 05/15/2010   HGBA1C 5.6 01/22/2016    No results found.  Assessment & Plan:   [name] was seen today for hypertension, annual exam, hyperlipidemia and rash.  Diagnoses and all orders for this visit:  Routine general medical examination at a health care facility- exam completed, labs ordered  and reviewed, vaccines were reviewed, patient education material was given. -     Lipid panel; Future -     Comprehensive metabolic panel; Future -     CBC with Differential/Platelet; Future -     TSH; Future  Essential hypertension, benign- his blood pressure is well-controlled  Hyperglycemia-  He has very mild prediabetes, no medications are needed at this time, he will continue to work on his lifestyle modifications. 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  Erythrasma- will treat with topical clindamycin -     clindamycin (CLINDAGEL) 1 % gel; Apply topically 2 (two) times daily. Apply to groin rash twice a day  Other orders -     finasteride (PROPECIA) 1 MG tablet; Take 1 tablet (1 mg total) by mouth daily.  I have discontinued Mr. Romack propranolol, oxyCODONE,  tiZANidine, and clonazePAM. I have also changed his finasteride. Additionally, I am having him start on terbinafine and clindamycin. Lastly, I am having him maintain his TURMERIC PO, ibuprofen, and acetaminophen.  Meds ordered this encounter  Medications  . finasteride (PROPECIA) 1 MG tablet    Sig: Take 1 tablet (1 mg total) by mouth daily.    Dispense:  90 tablet    Refill:  3  . terbinafine (LAMISIL) 250 MG tablet    Sig: Take 1 tablet (250 mg total) by mouth daily.    Dispense:  14 tablet    Refill:  0  . clindamycin (CLINDAGEL) 1 % gel    Sig: Apply topically 2 (two) times daily. Apply to groin rash twice a day    Dispense:  60 g    Refill:  2     Follow-up: Return in about 4 weeks (around 02/19/2016).  Sanda Lingerhomas Madasyn Heath, MD

## 2016-01-22 NOTE — Progress Notes (Signed)
Pre visit review using our clinic review tool, if applicable. No additional management support is needed unless otherwise documented below in the visit note. 

## 2016-01-22 NOTE — Patient Instructions (Signed)
Jock Itch  Jock itch (tinea cruris) is a fungal infection of the skin in the groin area. It is sometimes called ringworm, even though it is not caused by worms. It is caused by a fungus, which is a type of germ that thrives in dark, damp places. Jock itch causes a rash and itching in the groin and upper thigh area. It usually goes away in 2-3 weeks with treatment.  CAUSES  The fungus that causes jock itch may be spread by:  · Touching a fungus infection elsewhere on your body--such as athlete's foot--and then touching your groin area.  · Sharing towels or clothing with an infected person.  RISK FACTORS  Jock itch is most common in men and adolescent boys. This condition is more likely to develop from:  · Being in hot, humid climates.  · Wearing tight-fitting clothing or wet bathing suits for long periods of time.  · Participating in sports.  · Being overweight.  · Having diabetes.  SYMPTOMS  Symptoms of jock itch may include:  · A red, pink, or brown rash in the groin area. The rash may spread to the thighs, anus, and buttocks.  · Dry and scaly skin on or around the rash.  · Itchiness.  DIAGNOSIS  Most often, a health care provider can make the diagnosis by looking at your rash. Sometimes, a scraping of the infected skin will be taken. This sample may be tested by looking at it under a microscope or by trying to grow the fungus from the sample (culture).   TREATMENT  Treatment for this condition may include:  · Antifungal medicine to kill the fungus. This may be in various forms:    Skin cream or ointment.    Medicine taken by mouth.  · Skin cream or ointment to reduce the itching.  · Compresses or medicated powders to dry the infected skin.  HOME CARE INSTRUCTIONS  · Take medicines only as directed by your health care provider. Apply skin creams or ointments exactly as directed.  · Wear loose-fitting clothing.    Men should wear cotton boxer shorts.    Women should wear cotton underwear.  · Change your underwear  every day to keep your groin dry.  · Avoid hot baths.  · Dry your groin area well after bathing.    Use a separate towel to dry your groin area. This will help to prevent a spreading of the infection to other areas of your body.  · Do not scratch the affected area.  · Do not share towels with other people.  SEEK MEDICAL CARE IF:  · Your rash does not improve or it gets worse after 2 weeks of treatment.  · Your rash is spreading.  · Your rash returns after treatment is finished.  · You have a fever.  · You have redness, swelling, or pain in the area around your rash.  · You have fluid, blood, or pus coming from your rash.  · Your have your rash for more than 4 weeks.     This information is not intended to replace advice given to you by your health care provider. Make sure you discuss any questions you have with your health care provider.     Document Released: 08/07/2002 Document Revised: 09/07/2014 Document Reviewed: 05/29/2014  Elsevier Interactive Patient Education ©2016 [employer]

## 2016-04-22 DIAGNOSIS — M47816 Spondylosis without myelopathy or radiculopathy, lumbar region: Secondary | ICD-10-CM | POA: Diagnosis not present

## 2016-04-23 DIAGNOSIS — J343 Hypertrophy of nasal turbinates: Secondary | ICD-10-CM | POA: Diagnosis not present

## 2016-04-23 DIAGNOSIS — J342 Deviated nasal septum: Secondary | ICD-10-CM | POA: Diagnosis not present

## 2016-05-08 ENCOUNTER — Other Ambulatory Visit: Payer: Self-pay | Admitting: Otolaryngology

## 2016-05-08 DIAGNOSIS — J343 Hypertrophy of nasal turbinates: Secondary | ICD-10-CM | POA: Diagnosis not present

## 2016-05-08 DIAGNOSIS — J342 Deviated nasal septum: Secondary | ICD-10-CM | POA: Diagnosis not present

## 2016-05-18 DIAGNOSIS — Z23 Encounter for immunization: Secondary | ICD-10-CM | POA: Diagnosis not present

## 2016-12-08 ENCOUNTER — Ambulatory Visit (INDEPENDENT_AMBULATORY_CARE_PROVIDER_SITE_OTHER): Payer: BLUE CROSS/BLUE SHIELD | Admitting: Internal Medicine

## 2016-12-08 ENCOUNTER — Encounter: Payer: Self-pay | Admitting: Internal Medicine

## 2016-12-08 ENCOUNTER — Telehealth: Payer: Self-pay | Admitting: Internal Medicine

## 2016-12-08 ENCOUNTER — Other Ambulatory Visit: Payer: Self-pay | Admitting: Internal Medicine

## 2016-12-08 VITALS — BP 134/84 | HR 89 | Temp 98.0°F | Resp 16 | Ht 70.0 in | Wt 190.2 lb

## 2016-12-08 DIAGNOSIS — N454 Abscess of epididymis or testis: Secondary | ICD-10-CM | POA: Insufficient documentation

## 2016-12-08 DIAGNOSIS — N509 Disorder of male genital organs, unspecified: Secondary | ICD-10-CM

## 2016-12-08 DIAGNOSIS — N5089 Other specified disorders of the male genital organs: Secondary | ICD-10-CM

## 2016-12-08 MED ORDER — DOXYCYCLINE HYCLATE 100 MG PO TBEC: 100.0000 mg | DELAYED_RELEASE_TABLET | Freq: Two times a day (BID) | ORAL | 0 refills | Status: DC

## 2016-12-08 NOTE — Progress Notes (Signed)
Subjective:  Patient ID: [name] [name], male    DOB: 12-21-1975  Age: 41 y.o. MRN: [id number]  CC: Testicle Pain   HPI [name] Carbonneau presents for a 2 week hx of right scrotal discomfort with a hard nodule near the testicle.  Outpatient Medications Prior to Visit  Medication Sig Dispense Refill  . acetaminophen (TYLENOL) 500 MG tablet Take 500 mg by mouth daily. Reported on 01/22/2016    . finasteride (PROPECIA) 1 MG tablet Take 1 tablet (1 mg total) by mouth daily. 90 tablet 3  . TURMERIC PO Take 1,000 mg by mouth daily. Reported on 01/22/2016    . clindamycin (CLINDAGEL) 1 % gel Apply topically 2 (two) times daily. Apply to groin rash twice a day 60 g 2  . ibuprofen (ADVIL,MOTRIN) 200 MG tablet Take 200 mg by mouth. Reported on 01/22/2016     No facility-administered medications prior to visit.     ROS Review of Systems  Constitutional: Negative.  Negative for chills and fever.  HENT: Negative.   Eyes: Negative.   Respiratory: Negative.   Cardiovascular: Negative.   Gastrointestinal: Negative.  Negative for abdominal pain, nausea and vomiting.  Endocrine: Negative.   Genitourinary: Positive for scrotal swelling and testicular pain. Negative for decreased urine volume, difficulty urinating, discharge, dysuria, enuresis, flank pain, frequency, genital sores, hematuria, penile pain, penile swelling and urgency.  Musculoskeletal: Negative.  Negative for back pain.  Skin: Negative.  Negative for color change and rash.  Allergic/Immunologic: Negative.   Neurological: Negative.   Hematological: Negative.  Negative for adenopathy. Does not bruise/bleed easily.  Psychiatric/Behavioral: Negative.     Objective:  BP 134/84 (BP Location: Left Arm, Patient Position: Sitting, Cuff Size: Normal)   Pulse 89   Temp 98 F (36.7 C) (Oral)   Ht  (1.778 m)   Wt 190 lb 4 oz (86.3 kg)   SpO2 99%   BMI 27.30 kg/m   BP Readings from Last 3 Encounters:  12/08/16 134/84  01/22/16 118/72    10/21/15 120/68    Wt Readings from Last 3 Encounters:  12/08/16 190 lb 4 oz (86.3 kg)  01/22/16 188 lb (85.3 kg)  10/21/15 204 lb (92.5 kg)    Physical Exam  Constitutional: He is oriented to person, place, and time. No distress.  HENT:  Mouth/Throat: Oropharynx is clear and moist. No oropharyngeal exudate.  Eyes: Conjunctivae are normal. Right eye exhibits no discharge. Left eye exhibits no discharge. No scleral icterus.  Neck: Normal range of motion. Neck supple. No JVD present. No tracheal deviation present. No thyromegaly present.  Cardiovascular: Normal rate, regular rhythm, normal heart sounds and intact distal pulses.  Exam reveals no gallop and no friction rub.   No murmur heard. Pulmonary/Chest: Effort normal and breath sounds normal. No stridor. No respiratory distress. He has no wheezes. He has no rales. He exhibits no tenderness.  Abdominal: Soft. Bowel sounds are normal. He exhibits no distension and no mass. There is no tenderness. There is no rebound and no guarding. Hernia confirmed negative in the right inguinal area and confirmed negative in the left inguinal area.  Genitourinary: Penis normal. Right testis shows mass and tenderness. Right testis shows no swelling. Right testis is descended. Left testis shows no mass, no swelling and no tenderness. Left testis is descended. Circumcised. No penile erythema or penile tenderness. No discharge found.  Genitourinary Comments: Just behind and near the lower pole of the right testicle there is a 1 cm firm, mildly tender,  mass. It is separate from the testicle. It is freely mobile. The testicle is nontender and nonswollen. The remainder of the epididymis is nontender and nonswollen.  Musculoskeletal: Normal range of motion. He exhibits deformity. He exhibits no edema or tenderness.  Lymphadenopathy:    He has no cervical adenopathy.       Right: No inguinal adenopathy present.       Left: No inguinal adenopathy present.   Neurological: He is oriented to person, place, and time.  Skin: Skin is warm and dry. No rash noted. He is not diaphoretic. No erythema. No pallor.  Vitals reviewed.   Lab Results  Component Value Date   WBC 9.4 01/22/2016   HGB 14.5 01/22/2016   HCT 43.6 01/22/2016   PLT 350.0 01/22/2016   GLUCOSE 98 01/22/2016   CHOL 197 01/22/2016   TRIG 74.0 01/22/2016   HDL 51.90 01/22/2016   LDLDIRECT 158.0 09/09/2012   LDLCALC 130 (H) 01/22/2016   ALT 20 01/22/2016   AST 20 01/22/2016   NA 139 01/22/2016   K 3.6 01/22/2016   CL 103 01/22/2016   CREATININE 0.97 01/22/2016   BUN 12 01/22/2016   CO2 29 01/22/2016   TSH 0.53 01/22/2016   PSA 0.97 05/15/2010   HGBA1C 5.6 01/22/2016    No results found.  Assessment & Plan:   [name] was seen today for testicle pain.  Diagnoses and all orders for this visit:  Scrotal mass- The exam is consistent with a benign lesion such as a epididymal cyst or infection but there is some discomfort so I have asked him to undergo an ultrasound to be sure certain that there isn't a malignant lesion. He does not need anything for the discomfort. -     US Scrotum; Future -     [place] Art/Ven Flow Abd Pelv Doppler; Future  Abscess, epididymis- I will treat the infection with doxycycline -     US Scrotum; Future -     doxycycline (DORYX) 100 MG EC tablet; Take 1 tablet (100 mg total) by mouth 2 (two) times daily.   I have discontinued Mr. Ritson ibuprofen and clindamycin. I am also having him start on doxycycline. Additionally, I am having him maintain his TURMERIC PO, acetaminophen, and finasteride.  Meds ordered this encounter  Medications  . doxycycline (DORYX) 100 MG EC tablet    Sig: Take 1 tablet (100 mg total) by mouth 2 (two) times daily.    Dispense:  20 tablet    Refill:  0     Follow-up: Return in about 3 weeks (around 12/29/2016).  Sanda Linger, MD

## 2016-12-08 NOTE — Telephone Encounter (Signed)
Imaging also needs Scrotum Doplar added to the pt order, Code [id number]

## 2016-12-08 NOTE — Patient Instructions (Signed)
Epididymitis Epididymitis is swelling (inflammation) of the epididymis. The epididymis is a cord-like structure that is located along the top and back part of the testicle. It collects and stores sperm from the testicle. This condition can also cause pain and swelling of the testicle and scrotum. Symptoms usually start suddenly (acute epididymitis). Sometimes epididymitis starts gradually and lasts for a while (chronic epididymitis). This type may be harder to treat. What are the causes? In men 35 and younger, this condition is usually caused by a bacterial infection or sexually transmitted disease (STD), such as:  Gonorrhea.  Chlamydia. In men 35 and older who do not have anal sex, this condition is usually caused by bacteria from a blockage or abnormalities in the urinary system. These can result from:  Having a tube placed into the bladder (urinary catheter).  Having an enlarged or inflamed prostate gland.  Having recent urinary tract surgery. In men who have a condition that weakens the body's defense system (immune system), such as HIV, this condition can be caused by:  Other bacteria, including tuberculosis and syphilis.  Viruses.  Fungi. Sometimes this condition occurs without infection. That may happen if urine flows backward into the epididymis after heavy lifting or straining. What increases the risk? This condition is more likely to develop in men:  Who have unprotected sex with more than one partner.  Who have anal sex.  Who have recently had surgery.  Who have a urinary catheter.  Who have urinary problems.  Who have a suppressed immune system. What are the signs or symptoms? This condition usually begins suddenly with chills, fever, and pain behind the scrotum and in the testicle. Other symptoms include:  Swelling of the scrotum, testicle, or both.  Pain whenejaculatingor urinating.  Pain in the back or belly.  Nausea.  Itching and discharge from the  penis.  Frequent need to pass urine.  Redness and tenderness of the scrotum. How is this diagnosed? Your health care provider can diagnose this condition based on your symptoms and medical history. Your health care provider will also do a physical exam to ask about your symptoms and check your scrotum and testicle for swelling, pain, and redness. You may also have other tests, including:  Examination of discharge from the penis.  Urine tests for infections, such as STDs. Your health care provider may test you for other STDs, including HIV. How is this treated? Treatment for this condition depends on the cause. If your condition is caused by a bacterial infection, oral antibiotic medicine may be prescribed. If the bacterial infection has spread to your blood, you may need to receive IV antibiotics. Nonbacterial epididymitis is treated with home care that includes bed rest and elevation of the scrotum. Surgery may be needed to treat:  Bacterial epididymitis that causes pus to build up in the scrotum (abscess).  Chronic epididymitis that has not responded to other treatments. Follow these instructions at home: Medicines   Take over-the-counter and prescription medicines only as told by your health care provider.  If you were prescribed an antibiotic medicine, take it as told by your health care provider. Do not stop taking the antibiotic even if your condition improves. Sexual Activity   If your epididymitis was caused by an STD, avoid sexual activity until your treatment is complete.  Inform your sexual partner or partners if you test positive for an STD. They may need to be treated.Do not engage in sexual activity with your partner or partners until their treatment is   completed. General instructions   Return to your normal activities as told by your health care provider. Ask your health care provider what activities are safe for you.  Keep your scrotum elevated and supported while  resting. Ask your health care provider if you should wear a scrotal support, such as a jockstrap. Wear it as told by your health care provider.  If directed, apply ice to the affected area:  Put ice in a plastic bag.  Place a towel between your skin and the bag.  Leave the ice on for 20 minutes, 2-3 times per day.  Try taking a sitz bath to help with discomfort. This is a warm water bath that is taken while you are sitting down. The water should only come up to your hips and should cover your buttocks. Do this 3-4 times per day or as told by your health care provider.  Keep all follow-up visits as told by your health care provider. This is important. Contact a health care provider if:  You have a fever.  Your pain medicine is not helping.  Your pain is getting worse.  Your symptoms do not improve within three days. This information is not intended to replace advice given to you by your health care provider. Make sure you discuss any questions you have with your health care provider. Document Released: 08/14/2000 Document Revised: 01/23/2016 Document Reviewed: 01/02/2015 Elsevier Interactive Patient Education  2017 [employer].  

## 2016-12-08 NOTE — Progress Notes (Signed)
Pre visit review using our clinic review tool, if applicable. No additional management support is needed unless otherwise documented below in the visit note. 

## 2016-12-08 NOTE — Telephone Encounter (Signed)
Order has been entered. Forwarding to PCP for [facility]

## 2016-12-10 ENCOUNTER — Telehealth: Payer: Self-pay | Admitting: Internal Medicine

## 2016-12-10 ENCOUNTER — Other Ambulatory Visit: Payer: Self-pay | Admitting: Internal Medicine

## 2016-12-10 DIAGNOSIS — N454 Abscess of epididymis or testis: Secondary | ICD-10-CM

## 2016-12-10 MED ORDER — DOXYCYCLINE MONOHYDRATE 100 MG PO CAPS: 100.0000 mg | ORAL_CAPSULE | Freq: Two times a day (BID) | ORAL | 0 refills | Status: AC

## 2016-12-10 NOTE — Telephone Encounter (Signed)
New rx sent

## 2016-12-10 NOTE — Telephone Encounter (Signed)
Pt informed

## 2016-12-10 NOTE — Telephone Encounter (Signed)
doxycycline (DORYX) 100 MG EC tablet   Patient states the pharmacy does not have this rx. Can we please send it again. Thank you.

## 2016-12-10 NOTE — Telephone Encounter (Signed)
Contacted pharmacy and co-pay for patient for medication is $106, they do not have a delayed release capsule form please advise

## 2016-12-17 ENCOUNTER — Ambulatory Visit
Admission: RE | Admit: 2016-12-17 | Discharge: 2016-12-17 | Disposition: A | Discharge status: Home / Self Care | Payer: BLUE CROSS/BLUE SHIELD | Source: Ambulatory Visit | Attending: Internal Medicine | Admitting: Internal Medicine

## 2016-12-17 DIAGNOSIS — N5089 Other specified disorders of the male genital organs: Secondary | ICD-10-CM

## 2016-12-17 DIAGNOSIS — N454 Abscess of epididymis or testis: Secondary | ICD-10-CM

## 2016-12-18 ENCOUNTER — Telehealth: Payer: Self-pay | Admitting: Internal Medicine

## 2016-12-18 NOTE — Telephone Encounter (Signed)
Please call Pt back with results from Imaging yesterday.

## 2016-12-18 NOTE — Telephone Encounter (Signed)
Pt called back about results from imaging.

## 2016-12-21 NOTE — Telephone Encounter (Signed)
Patient called again about results. I informed him of the notes. He understood. Starts he had no questions and could call if he did. He's doing good right now. Thank you.

## 2017-01-08 ENCOUNTER — Other Ambulatory Visit: Payer: Self-pay | Admitting: Internal Medicine

## 2017-04-25 IMAGING — US US SCROTUM
1 series · 13 of 25 positions shown · non-contrast
Comparison: None.

CLINICAL DATA: 40-year-old male with right side scrotal mass
discovered 3 weeks ago. Right scrotal tenderness. Palpable extra
testicular mass.

EXAM:
SCROTAL ULTRASOUND
DOPPLER ULTRASOUND OF THE TESTICLES
TECHNIQUE: Complete ultrasound examination of the testicles, epididymis, and
other scrotal structures was performed. Color and spectral Doppler
ultrasound were also utilized to evaluate blood flow to the
testicles.

[Series 1: us scrotum · 0.08mm/px · 50 acquisitions, 13 frames shown]
[im 1/50]
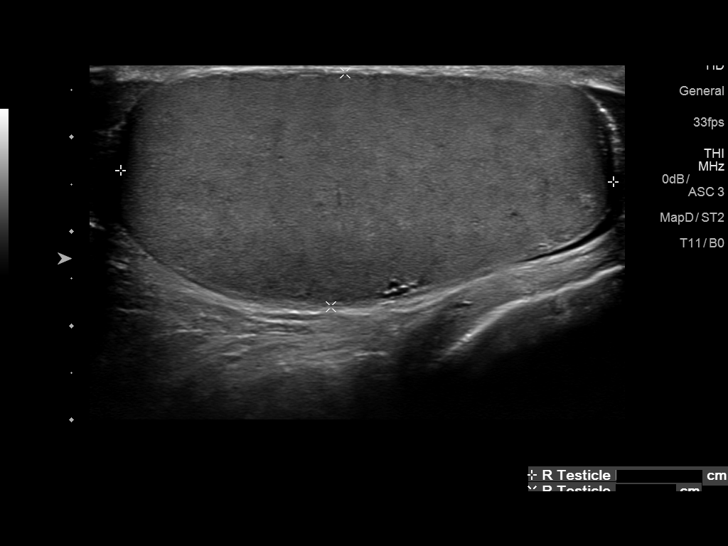
[im 5/50]
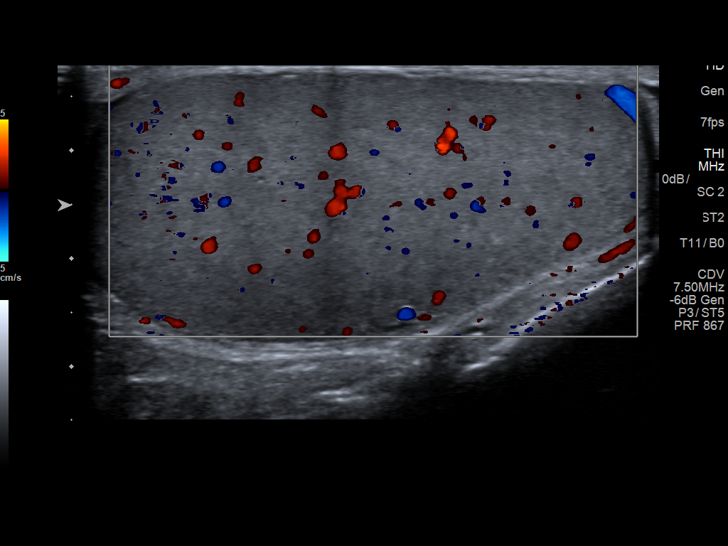
[im 9/50]
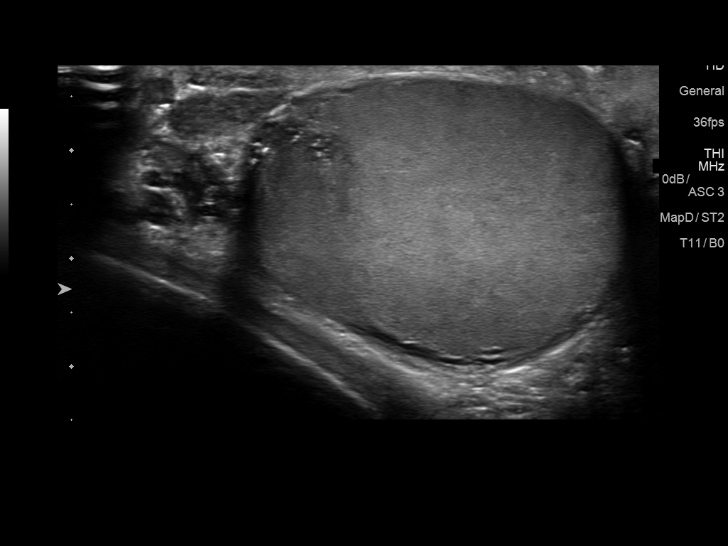
[im 13/50]
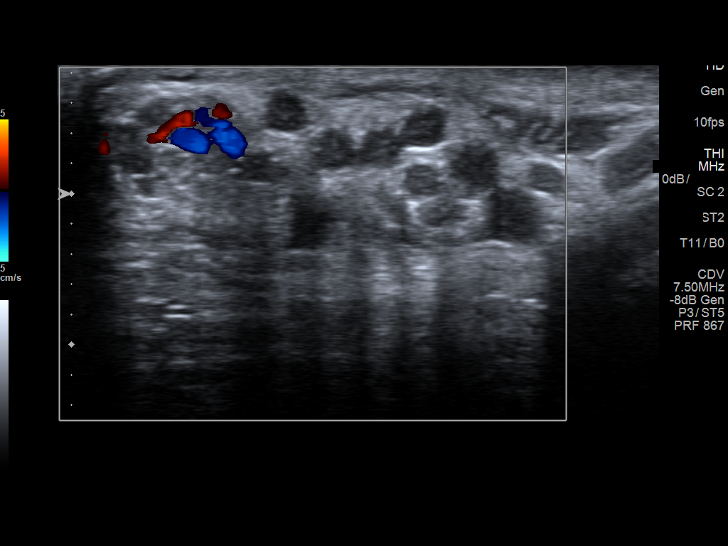
[im 17/50]
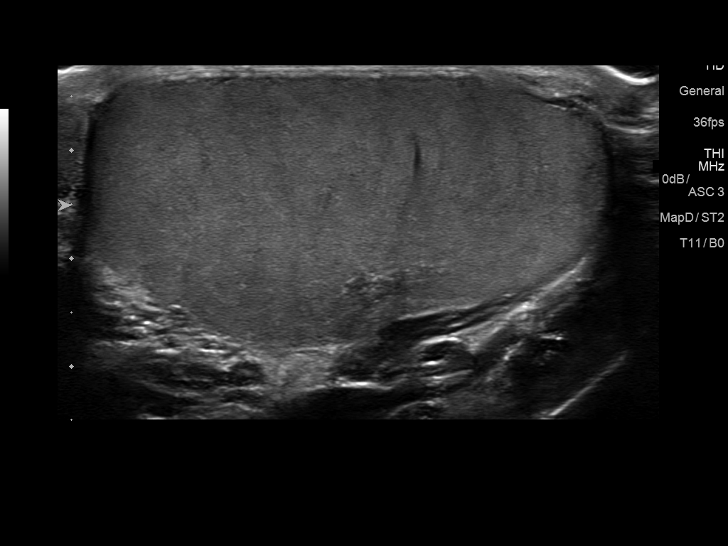
[im 21/50]
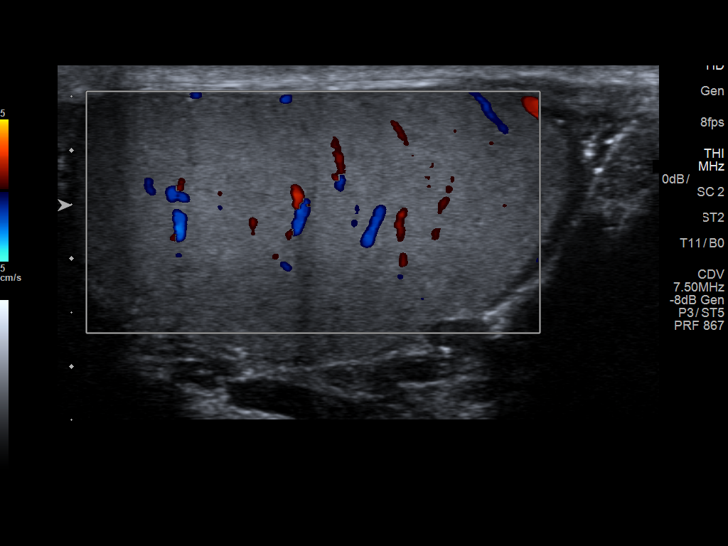
[im 25/50]
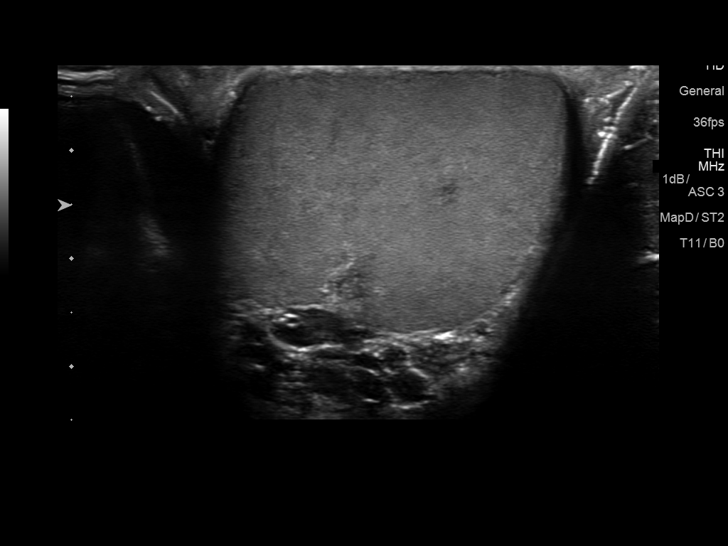
[im 29/50]
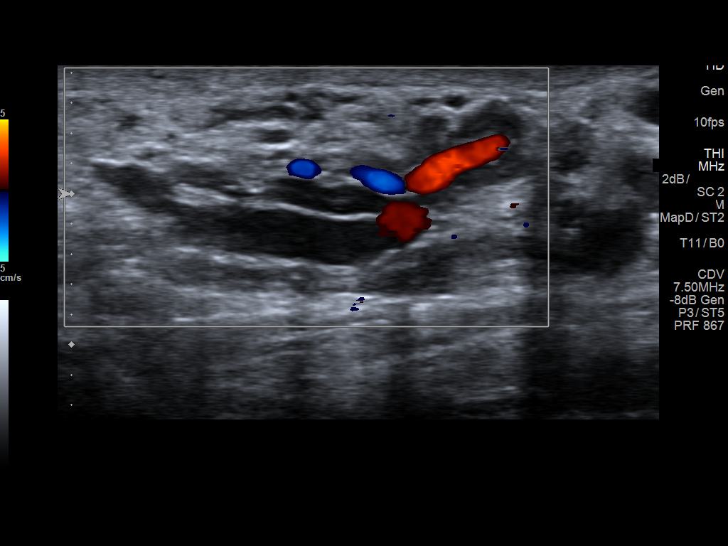
[im 33/50]
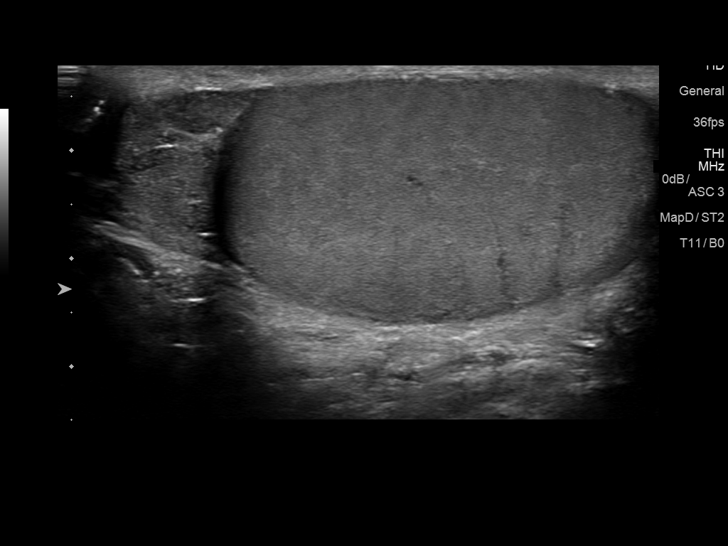
[im 37/50]
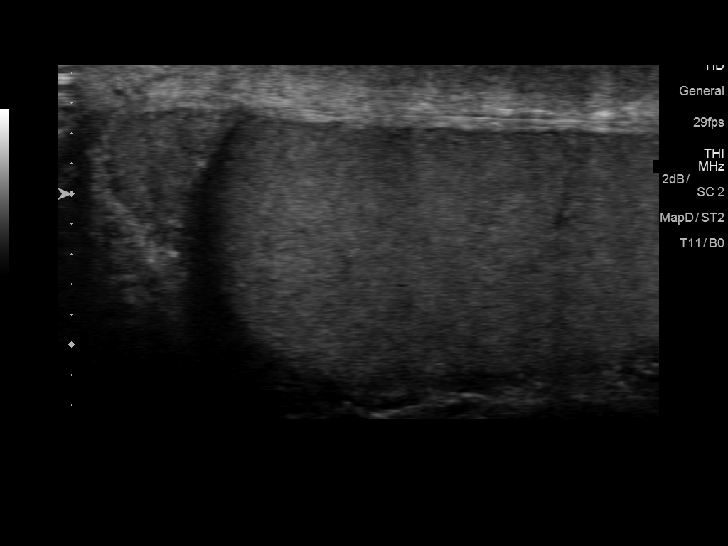
[im 41/50]
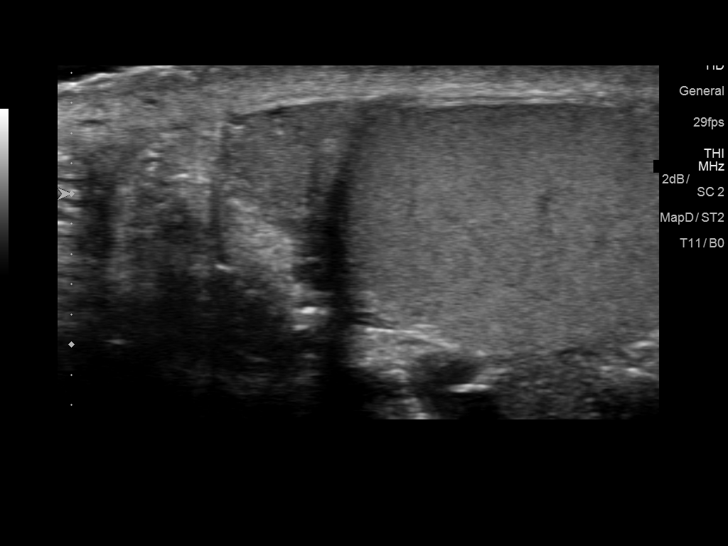
[im 45/50]
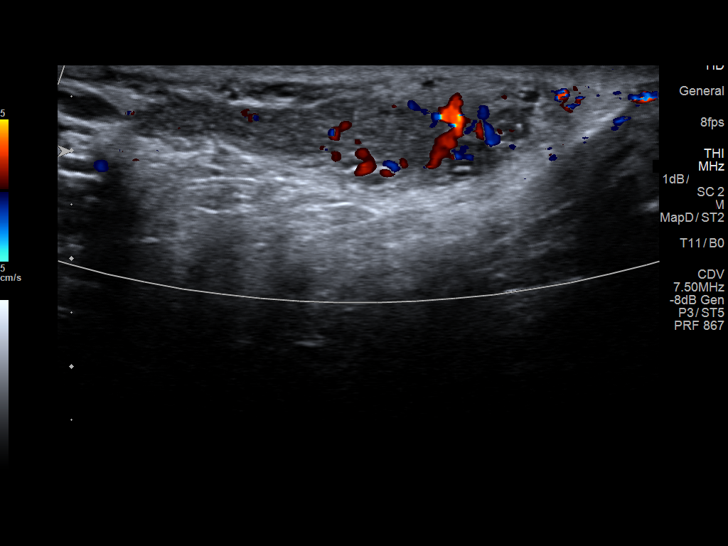
[im 50/50]
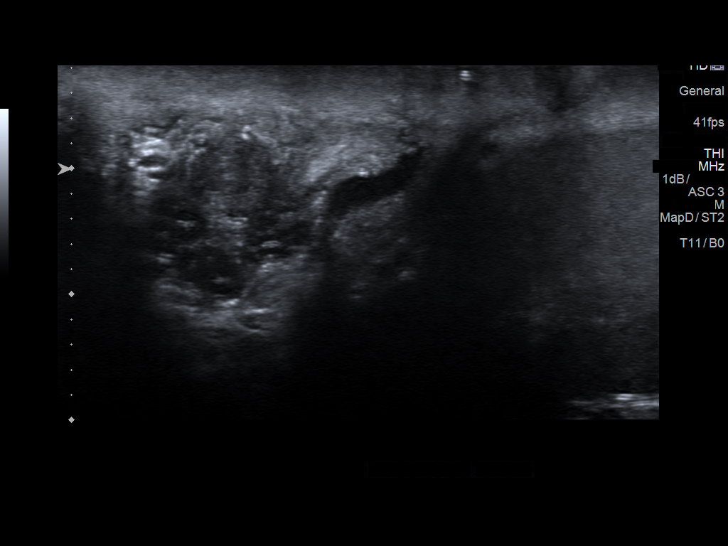

[13 of 25 positions shown; findings below may reference images not displayed]

FINDINGS: Right testicle

Measurements: 5.2 x 2.5 x 3.6 cm. No mass or microlithiasis
visualized.

Left testicle

Measurements: 4.8 x 2.5 x 3.2 cm. No mass or microlithiasis
visualized.

Right epididymis:  Normal in size and appearance.

Left epididymis:  Normal in size and appearance.

Hydrocele:  Trace left hydrocele.

Varicocele: Symmetric appearing prominence of the bilateral
pampiniform venous plexus without convincing varicocele.

Pulsed Doppler interrogation of both testes demonstrates normal low
resistance arterial and venous waveforms bilaterally.

Other findings: The palpable abnormality localized by the patient
corresponds to the tail of the right epididymis. The right
epididymis including the tail appears within normal limits on
grayscale ultrasound. Questionable hypervascularity in this area on
Doppler (image 50).
IMPRESSION: 1. Palpable area of concern seems to correspond to the tail of the
epididymis which appears largely normal but could be mildly
inflamed, such as due to epididymitis.
2. Otherwise negative scrotal ultrasound with Doppler; small left
hydrocele appears inconsequential.

## 2017-12-24 ENCOUNTER — Other Ambulatory Visit: Payer: Self-pay | Admitting: Internal Medicine

## 2018-06-12 ENCOUNTER — Other Ambulatory Visit: Payer: Self-pay | Admitting: Internal Medicine

## 2018-12-04 ENCOUNTER — Other Ambulatory Visit: Payer: Self-pay | Admitting: Internal Medicine

## 2019-06-02 ENCOUNTER — Other Ambulatory Visit: Payer: Self-pay | Admitting: Internal Medicine

## 2019-10-21 ENCOUNTER — Ambulatory Visit: Payer: BC Managed Care – PPO | Attending: Internal Medicine

## 2019-10-21 DIAGNOSIS — Z23 Encounter for immunization: Secondary | ICD-10-CM | POA: Insufficient documentation

## 2019-10-21 NOTE — Progress Notes (Signed)
   Covid-19 Vaccination Clinic  Name:  [name] [name]    MRN: [id number] DOB: [birth year]  10/21/2019  Mr. Borgmeyer was observed post Covid-19 immunization for 15 minutes without incidence. He was provided with Vaccine Information Sheet and instruction to access the V-Safe system.   Mr. Pineiro was instructed to call 911 with any severe reactions post vaccine: Marland Kitchen Difficulty breathing  . Swelling of your face and throat  . A fast heartbeat  . A bad rash all over your body  . Dizziness and weakness    Immunizations Administered    Name Date Dose VIS Date Route   Pfizer COVID-19 Vaccine 10/21/2019 11:19 AM 0.3 mL 08/11/2019 Intramuscular   Manufacturer: [employer], [employer]   Lot: [id number]   NDC: [address]-3

## 2019-11-15 ENCOUNTER — Ambulatory Visit: Payer: BC Managed Care – PPO | Attending: Internal Medicine

## 2019-11-15 DIAGNOSIS — Z23 Encounter for immunization: Secondary | ICD-10-CM

## 2019-11-15 NOTE — Progress Notes (Signed)
   Covid-19 Vaccination Clinic  Name:  [name] [name]    MRN: [id number] DOB: Apr 17, 1976  11/15/2019  [name] was observed post Covid-19 immunization for 15 minutes without incident. He was provided with Vaccine Information Sheet and instruction to access the V-Safe system.   [name] was instructed to call 911 with any severe reactions post vaccine: Marland Kitchen Difficulty breathing  . Swelling of face and throat  . A fast heartbeat  . A bad rash all over body  . Dizziness and weakness   Immunizations Administered    Name Date Dose VIS Date Route   Pfizer COVID-19 Vaccine 11/15/2019  4:00 PM 0.3 mL 08/11/2019 Intramuscular   Manufacturer: [employer], [employer]   Lot: [id number]   NDC: [address]-4    Nursing Pain Medication Assessment:  Safety precautions to be maintained throughout the outpatient stay will include: orient to surroundings, keep bed in low position, maintain call bell within reach at all times, provide assistance with transfer out of bed and ambulation.

## 2019-12-27 ENCOUNTER — Other Ambulatory Visit: Payer: Self-pay | Admitting: Internal Medicine

## 2020-01-17 ENCOUNTER — Encounter: Payer: BC Managed Care – PPO | Admitting: Internal Medicine

## 2020-01-25 ENCOUNTER — Encounter: Payer: Self-pay | Admitting: Internal Medicine

## 2020-01-25 ENCOUNTER — Other Ambulatory Visit: Payer: Self-pay

## 2020-01-25 ENCOUNTER — Ambulatory Visit (INDEPENDENT_AMBULATORY_CARE_PROVIDER_SITE_OTHER): Payer: 59 | Admitting: Internal Medicine

## 2020-01-25 VITALS — BP 118/68 | HR 79 | Temp 98.5°F | Resp 16 | Ht 70.0 in | Wt 176.4 lb

## 2020-01-25 DIAGNOSIS — Z0000 Encounter for general adult medical examination without abnormal findings: Secondary | ICD-10-CM

## 2020-01-25 DIAGNOSIS — E785 Hyperlipidemia, unspecified: Secondary | ICD-10-CM

## 2020-01-25 DIAGNOSIS — I10 Essential (primary) hypertension: Secondary | ICD-10-CM

## 2020-01-25 LAB — LIPID PANEL
Cholesterol: 210 mg/dL — ABNORMAL HIGH (ref 0–200)
HDL: 63.8 mg/dL (ref >39.00)
LDL Cholesterol: 128 mg/dL — ABNORMAL HIGH (ref 0–99)
NonHDL: 145.79
Total CHOL/HDL Ratio: 3
Triglycerides: 89 mg/dL (ref 0.0–149.0)
VLDL: 17.8 mg/dL (ref 0.0–40.0)

## 2020-01-25 LAB — PSA: PSA: 0.79 ng/mL (ref 0.10–4.00)

## 2020-01-25 NOTE — Progress Notes (Signed)
Subjective:  Patient ID: [name] [name], male    DOB: 12/31/1975  Age: 44 y.o. MRN: [id number]  CC: Annual Exam  This visit occurred during the SARS-CoV-2 public health emergency.  Safety protocols were in place, including screening questions prior to the visit, additional usage of staff PPE, and extensive cleaning of exam room while observing appropriate contact time as indicated for disinfecting solutions.    HPI [name] [name] presents for a CPX.  He has a history of hypertension.  For the last few years he has worked diligently on his lifestyle modifications and tells me his blood pressure has been well controlled.  He is very active and denies any recent episodes of chest pain, shortness of breath, palpitations, edema, or fatigue.  Outpatient Medications Prior to Visit  Medication Sig Dispense Refill  . acetaminophen (TYLENOL) 500 MG tablet Take 500 mg by mouth daily. Reported on 01/22/2016    . finasteride (PROPECIA) 1 MG tablet TAKE 1 TABLET BY MOUTH EVERY DAY 90 tablet 1  . TURMERIC PO Take 1,000 mg by mouth daily. Reported on 01/22/2016     No facility-administered medications prior to visit.    ROS Review of Systems  Constitutional: Negative for diaphoresis, fatigue and unexpected weight change.  HENT: Negative.   Eyes: Negative.   Respiratory: Negative for cough, chest tightness, shortness of breath and wheezing.   Cardiovascular: Negative for chest pain, palpitations and leg swelling.  Gastrointestinal: Negative for abdominal pain, constipation, diarrhea, nausea and vomiting.  Endocrine: Negative.   Genitourinary: Negative.  Negative for difficulty urinating, scrotal swelling, testicular pain and urgency.  Musculoskeletal: Negative.  Negative for arthralgias and myalgias.  Skin: Negative.  Negative for color change.  Allergic/Immunologic: Negative.   Neurological: Negative.  Negative for dizziness, weakness and headaches.  Hematological: Negative for adenopathy. Does not  bruise/bleed easily.  Psychiatric/Behavioral: Negative.     Objective:  BP 118/68 (BP Location: Left Arm, Patient Position: Sitting, Cuff Size: Large)   Pulse 79   Temp 98.5 F (36.9 C) (Oral)   Resp 16   Ht 5\' 10"  (1.778 m)   Wt 176 lb 6 oz (80 kg)   SpO2 97%   BMI 25.31 kg/m   BP Readings from Last 3 Encounters:  01/25/20 118/68  12/08/16 134/84  01/22/16 118/72    Wt Readings from Last 3 Encounters:  01/25/20 176 lb 6 oz (80 kg)  12/08/16 190 lb 4 oz (86.3 kg)  01/22/16 188 lb (85.3 kg)    Physical Exam Vitals reviewed.  Constitutional:      Appearance: Normal appearance.  HENT:     Nose: Nose normal.     Mouth/Throat:     Mouth: Mucous membranes are moist.  Eyes:     General: No scleral icterus.    Conjunctiva/sclera: Conjunctivae normal.  Cardiovascular:     Rate and Rhythm: Normal rate and regular rhythm.     Heart sounds: No murmur.  Pulmonary:     Effort: Pulmonary effort is normal.     Breath sounds: No stridor. No wheezing, rhonchi or rales.  Abdominal:     General: Abdomen is flat.     Palpations: There is no mass.     Tenderness: There is no abdominal tenderness. There is no guarding.  Musculoskeletal:        General: Normal range of motion.     Cervical back: Neck supple.     Right lower leg: No edema.     Left lower leg: No edema.  Lymphadenopathy:     Cervical: No cervical adenopathy.  Skin:    General: Skin is warm and dry.     Coloration: Skin is not pale.  Neurological:     General: No focal deficit present.  Psychiatric:        Mood and Affect: Mood normal.        Behavior: Behavior normal.     Lab Results  Component Value Date   WBC 9.4 01/22/2016   HGB 14.5 01/22/2016   HCT 43.6 01/22/2016   PLT 350.0 01/22/2016   GLUCOSE 98 01/22/2016   CHOL 210 (H) 01/25/2020   TRIG 89.0 01/25/2020   HDL 63.80 01/25/2020   LDLDIRECT 158.0 09/09/2012   LDLCALC 128 (H) 01/25/2020   ALT 20 01/22/2016   AST 20 01/22/2016   NA 139  01/22/2016   K 3.6 01/22/2016   CL 103 01/22/2016   CREATININE 0.97 01/22/2016   BUN 12 01/22/2016   CO2 29 01/22/2016   TSH 0.53 01/22/2016   PSA 0.79 01/25/2020   HGBA1C 5.6 01/22/2016    US Scrotum  Result Date: 12/17/2016 CLINICAL DATA:  44 year old male with right side scrotal mass discovered 3 weeks ago. Right scrotal tenderness. Palpable extra testicular mass. EXAM: SCROTAL ULTRASOUND DOPPLER ULTRASOUND OF THE TESTICLES TECHNIQUE: Complete ultrasound examination of the testicles, epididymis, and other scrotal structures was performed. Color and spectral Doppler ultrasound were also utilized to evaluate blood flow to the testicles. COMPARISON:  None. FINDINGS: Right testicle Measurements: 5.2 x 2.5 x 3.6 cm. No mass or microlithiasis visualized. Left testicle Measurements: 4.8 x 2.5 x 3.2 cm. No mass or microlithiasis visualized. Right epididymis:  Normal in size and appearance. Left epididymis:  Normal in size and appearance. Hydrocele:  Trace left hydrocele. Varicocele: Symmetric appearing prominence of the bilateral pampiniform venous plexus without convincing varicocele. Pulsed Doppler interrogation of both testes demonstrates normal low resistance arterial and venous waveforms bilaterally. Other findings: The palpable abnormality localized by the patient corresponds to the tail of the right epididymis. The right epididymis including the tail appears within normal limits on grayscale ultrasound. Questionable hypervascularity in this area on Doppler (image 50). IMPRESSION: 1. Palpable area of concern seems to correspond to the tail of the epididymis which appears largely normal but could be mildly inflamed, such as due to epididymitis. 2. Otherwise negative scrotal ultrasound with Doppler; small left hydrocele appears inconsequential. Electronically Signed   By: Odessa Fleming M.D.   On: 12/17/2016 16:20   [place] Art/Ven Flow Abd Pelv Doppler  Result Date: 12/17/2016 CLINICAL DATA:  44 year old male  with right side scrotal mass discovered 3 weeks ago. Right scrotal tenderness. Palpable extra testicular mass. EXAM: SCROTAL ULTRASOUND DOPPLER ULTRASOUND OF THE TESTICLES TECHNIQUE: Complete ultrasound examination of the testicles, epididymis, and other scrotal structures was performed. Color and spectral Doppler ultrasound were also utilized to evaluate blood flow to the testicles. COMPARISON:  None. FINDINGS: Right testicle Measurements: 5.2 x 2.5 x 3.6 cm. No mass or microlithiasis visualized. Left testicle Measurements: 4.8 x 2.5 x 3.2 cm. No mass or microlithiasis visualized. Right epididymis:  Normal in size and appearance. Left epididymis:  Normal in size and appearance. Hydrocele:  Trace left hydrocele. Varicocele: Symmetric appearing prominence of the bilateral pampiniform venous plexus without convincing varicocele. Pulsed Doppler interrogation of both testes demonstrates normal low resistance arterial and venous waveforms bilaterally. Other findings: The palpable abnormality localized by the patient corresponds to the tail of the right epididymis. The right epididymis including the tail appears  within normal limits on grayscale ultrasound. Questionable hypervascularity in this area on Doppler (image 50). IMPRESSION: 1. Palpable area of concern seems to correspond to the tail of the epididymis which appears largely normal but could be mildly inflamed, such as due to epididymitis. 2. Otherwise negative scrotal ultrasound with Doppler; small left hydrocele appears inconsequential. Electronically Signed   By: Genevie Ann M.D.   On: 12/17/2016 16:20    Assessment & Plan:   Mali was seen today for annual exam.  Diagnoses and all orders for this visit:  Routine general medical examination at a health care facility- Exam completed, labs reviewed - He does not have an elevated ASCVD risk score so I did not recommend a statin for CV risk reduction., vaccines reviewed and updated, patient education material  was given. -     Lipid panel; Future -     HIV Antibody (routine testing w rflx); Future -     PSA; Future -     PSA -     HIV Antibody (routine testing w rflx) -     Lipid panel  Essential hypertension, benign  Hyperlipidemia with target LDL less than 160   I am having Mali Pylant maintain his TURMERIC PO, acetaminophen, and finasteride.  No orders of the defined types were placed in this encounter.    Follow-up: Return in about 1 year (around 01/24/2021).  Scarlette Calico, MD

## 2020-01-25 NOTE — Patient Instructions (Signed)

## 2020-01-26 ENCOUNTER — Encounter: Payer: Self-pay | Admitting: Internal Medicine

## 2020-01-26 LAB — HIV ANTIBODY (ROUTINE TESTING W REFLEX): HIV 1&2 Ab, 4th Generation: NONREACTIVE

## 2020-02-01 ENCOUNTER — Other Ambulatory Visit: Payer: Self-pay | Admitting: Internal Medicine

## 2020-07-24 ENCOUNTER — Other Ambulatory Visit: Payer: Self-pay | Admitting: Internal Medicine

## 2021-01-13 ENCOUNTER — Other Ambulatory Visit: Payer: Self-pay | Admitting: Internal Medicine

## 2021-07-03 ENCOUNTER — Other Ambulatory Visit: Payer: Self-pay | Admitting: Internal Medicine

## 2021-07-20 ENCOUNTER — Encounter: Payer: Self-pay | Admitting: Gastroenterology

## 2021-09-26 ENCOUNTER — Ambulatory Visit (AMBULATORY_SURGERY_CENTER): Payer: 59

## 2021-09-26 ENCOUNTER — Other Ambulatory Visit: Payer: Self-pay

## 2021-09-26 VITALS — Ht 70.0 in | Wt 175.0 lb

## 2021-09-26 DIAGNOSIS — Z86010 Personal history of colonic polyps: Secondary | ICD-10-CM

## 2021-09-26 DIAGNOSIS — Z800 Family history of malignant neoplasm of digestive organs: Secondary | ICD-10-CM

## 2021-09-26 MED ORDER — NA SULFATE-K SULFATE-MG SULF 17.5-3.13-1.6 GM/177ML PO SOLN: 1.0000 | Freq: Once | ORAL | 0 refills | Status: AC

## 2021-09-26 NOTE — Progress Notes (Signed)
No egg or soy allergy known to patient  °No issues known to pt with past sedation with any surgeries or procedures °Patient denies ever being told they had issues or difficulty with intubation  °No FH of Malignant Hyperthermia °Pt is not on diet pills °Pt is not on home 02  °Pt is not on blood thinners  °Pt denies issues with constipation  °No A fib or A flutter °Pt is fully vaccinated for Covid x 2; °NO PA's for preps discussed with pt in PV today  °Discussed with pt there will be an out-of-pocket cost for prep and that varies from $0 to 70 + dollars - pt verbalized understanding  °Due to the COVID-19 pandemic we are asking patients to follow certain guidelines in PV and the [facility]   °Pt aware of COVID protocols and [facility] guidelines  °PV completed over the phone. Pt verified name, DOB, address and insurance during PV today.  °Pt mailed instruction packet with copy of consent form to read and not return, and instructions.  °Pt encouraged to call with questions or issues.  °If pt has My chart, procedure instructions sent via My Chart  ° °

## 2021-09-29 ENCOUNTER — Encounter: Payer: Self-pay | Admitting: Gastroenterology

## 2021-10-03 ENCOUNTER — Ambulatory Visit (AMBULATORY_SURGERY_CENTER): Payer: 59 | Admitting: Gastroenterology

## 2021-10-03 ENCOUNTER — Encounter: Payer: Self-pay | Admitting: Gastroenterology

## 2021-10-03 ENCOUNTER — Other Ambulatory Visit: Payer: Self-pay

## 2021-10-03 VITALS — BP 110/75 | HR 74 | Temp 98.6°F | Resp 14 | Ht 70.0 in | Wt 175.0 lb

## 2021-10-03 DIAGNOSIS — Z86010 Personal history of colonic polyps: Secondary | ICD-10-CM

## 2021-10-03 DIAGNOSIS — Z800 Family history of malignant neoplasm of digestive organs: Secondary | ICD-10-CM

## 2021-10-03 MED ORDER — SODIUM CHLORIDE 0.9 % IV SOLN: 500.0000 mL | INTRAVENOUS | Status: DC

## 2021-10-03 NOTE — Op Note (Signed)
[place] Patient Name: [name] [name] Procedure Date: 10/03/2021 1:18 PM MRN: [id number] Endoscopist: Milus Banister , MD Age: 46 Referring MD:  Date of Birth: 20-Jun-1976 Gender: Male Account #: [id number] Procedure:                Colonoscopy Indications:              Screening in patient at increased risk: Family                            history of 1st-degree relative with colorectal                            cancer before age 1 years Colonsocopy 2011 single                            subCM TA. Colonoscopy 10/2015 normal. Mother had                            colon cancer in her 53s Medicines:                Monitored Anesthesia Care Procedure:                Pre-Anesthesia Assessment:                           - Prior to the procedure, a History and Physical                            was performed, and patient medications and                            allergies were reviewed. The patient's tolerance of                            previous anesthesia was also reviewed. The risks                            and benefits of the procedure and the sedation                            options and risks were discussed with the patient.                            All questions were answered, and informed consent                            was obtained. Prior Anticoagulants: The patient has                            taken no previous anticoagulant or antiplatelet                            agents. ASA Grade Assessment: II - A patient with  mild systemic disease. After reviewing the risks                            and benefits, the patient was deemed in                            satisfactory condition to undergo the procedure.                           After obtaining informed consent, the colonoscope                            was passed under direct vision. Throughout the                            procedure, the patient's blood pressure, pulse,  and                            oxygen saturations were monitored continuously. The                            Olympus CF-HQ190L [id number]) Colonoscope was                            introduced through the anus and advanced to the the                            cecum, identified by appendiceal orifice and                            ileocecal valve. The colonoscopy was performed                            without difficulty. The patient tolerated the                            procedure well. The quality of the bowel                            preparation was good. The ileocecal valve,                            appendiceal orifice, and rectum were photographed. Scope In: 1:27:30 PM Scope Out: 1:37:37 PM Scope Withdrawal Time: 0 hours 7 minutes 41 seconds  Total Procedure Duration: 0 hours 10 minutes 7 seconds  Findings:                 The entire examined colon appeared normal on direct                            and retroflexion views. Complications:            No immediate complications. Estimated blood loss:  None. Estimated Blood Loss:     Estimated blood loss: none. Impression:               - The entire examined colon is normal on direct and                            retroflexion views.                           - No polyps or cancers. Recommendation:           - Patient has a contact number available for                            emergencies. The signs and symptoms of potential                            delayed complications were discussed with the                            patient. Return to normal activities tomorrow.                            Written discharge instructions were provided to the                            patient.                           - Resume previous diet.                           - Continue present medications.                           - Repeat colonoscopy in 5 years for screening given                            your  family history of colon cancer. Milus Banister, MD 10/03/2021 1:39:53 PM This report has been signed electronically.

## 2021-10-03 NOTE — Progress Notes (Signed)
HPI: This is a man at elevated risk for [facility]  Colonsocopy 2011 single subCM TA.  Colonoscopy 10/2015 normal.  Mother had colon cancer in her 21s   ROS: complete GI ROS as described in HPI, all other review negative.  Constitutional:  No unintentional weight loss   Past Medical History:  Diagnosis Date   Anxiety    hx of   History of colon polyps    Male pattern baldness     Past Surgical History:  Procedure Laterality Date   CHOLECYSTECTOMY N/A 08/29/2014   Procedure: LAPAROSCOPIC CHOLECYSTECTOMY WITH INTRAOPERATIVE CHOLANGIOGRAM;  Surgeon: Georganna Skeans, MD;  Location: [facility];  Service: General;  Laterality: N/A;   COLONOSCOPY  2017   DJ-MAC-suprep(good)-normal   COLONOSCOPY  2011   TA    Current Outpatient Medications  Medication Sig Dispense Refill   finasteride (PROPECIA) 1 MG tablet TAKE 1 TABLET BY MOUTH EVERY DAY 90 tablet 1   TURMERIC PO Take 1,000 mg by mouth daily. Reported on 01/22/2016     acetaminophen (TYLENOL) 500 MG tablet Take 500 mg by mouth every 8 (eight) hours as needed. Reported on 01/22/2016     ibuprofen (ADVIL) 800 MG tablet Take 800 mg by mouth every 6 (six) hours as needed.     Current Facility-Administered Medications  Medication Dose Route Frequency Provider Last Rate Last Admin   0.9 %  sodium chloride infusion  500 mL Intravenous Continuous Milus Banister, MD        Allergies as of 10/03/2021 - Review Complete 10/03/2021  Allergen Reaction Noted   Gluten meal Nausea And Vomiting 08/17/2014    Family History  Problem Relation Age of Onset   Colon polyps Mother 51   Colon cancer Mother 99   Cancer Mother        colon - 32's and 76's   Crohn's disease Brother    Heart disease Brother    Celiac disease Brother    Other Brother        Celiac sprue   Prostate cancer Other    Breast cancer Other    Cancer Other        breast and prostate   Alcohol abuse Neg Hx    Drug abuse Neg Hx    Early death Neg Hx    Hearing loss Neg Hx     Hyperlipidemia Neg Hx    Hypertension Neg Hx    Kidney disease Neg Hx    Stroke Neg Hx    Esophageal cancer Neg Hx    Rectal cancer Neg Hx    Stomach cancer Neg Hx     Social History   Socioeconomic History   Marital status: Married    Spouse name: Not on file   Number of children: 2   Years of education: Not on file   Highest education level: Not on file  Occupational History   Occupation: Criminal Counsellor  Tobacco Use   Smoking status: Never   Smokeless tobacco: Never  Vaping Use   Vaping Use: Never used  Substance and Sexual Activity   Alcohol use: No    Alcohol/week: 0.0 standard drinks   Drug use: No   Sexual activity: Yes  Other Topics Concern   Not on file  Social History Narrative   Daily Caffeine Use:  4   Does not smoke or drink much alcohol         Social Determinants of Health   Financial Resource Strain: Not on file  Food Insecurity: Not on file  Transportation Needs: Not on file  Physical Activity: Not on file  Stress: Not on file  Social Connections: Not on file  Intimate Partner Violence: Not on file     Physical Exam: BP 122/77    Pulse 71    Temp 98.6 F (37 C) (Temporal)    Ht _0  (1.778 m)    Wt 175 lb (79.4 kg)    SpO2 97%    BMI 25.11 kg/m  Constitutional: generally well-appearing Psychiatric: alert and oriented x3 Lungs: CTA bilaterally Heart: no MCR  Assessment and plan: 46 y.o. male with increased risk for [facility]  Colonsocoy today  Care is appropriate for the ambulatory setting.  Owens Loffler, MD [facility] 10/03/2021, 1:03 PM

## 2021-10-03 NOTE — Progress Notes (Signed)
Pt's states no medical or surgical changes since previsit or office visit. 

## 2021-10-03 NOTE — Patient Instructions (Signed)
Your next colonoscopy should occur in 5 years.    You may resume your previous diet and medication schedule.  Thank you for allowing [place] to care for you today!!!   YOU HAD AN ENDOSCOPIC PROCEDURE TODAY AT THE [facility]:   Refer to the procedure report that was given to you for any specific questions about what was found during the examination.  If the procedure report does not answer your questions, please call your gastroenterologist to clarify.  If you requested that your care partner not be given the details of your procedure findings, then the procedure report has been included in a sealed envelope for you to review at your convenience later.  YOU SHOULD EXPECT: Some feelings of bloating in the abdomen. Passage of more gas than usual.  Walking can help get rid of the air that was put into your GI tract during the procedure and reduce the bloating. If you had a lower endoscopy (such as a colonoscopy or flexible sigmoidoscopy) you may notice spotting of blood in your stool or on the toilet paper. If you underwent a bowel prep for your procedure, you may not have a normal bowel movement for a few days.  Please Note:  You might notice some irritation and congestion in your nose or some drainage.  This is from the oxygen used during your procedure.  There is no need for concern and it should clear up in a day or so.  SYMPTOMS TO REPORT IMMEDIATELY:  Following lower endoscopy (colonoscopy or flexible sigmoidoscopy):  Excessive amounts of blood in the stool  Significant tenderness or worsening of abdominal pains  Swelling of the abdomen that is new, acute  Fever of 100F or higher  For urgent or emergent issues, a gastroenterologist can be reached at any hour by calling [phone]. Do not use MyChart messaging for urgent concerns.    DIET:  We do recommend a small meal at first, but then you may proceed to your regular diet.  Drink plenty of fluids but you should avoid  alcoholic beverages for 24 hours.  ACTIVITY:  You should plan to take it easy for the rest of today and you should NOT DRIVE or use heavy machinery until tomorrow (because of the sedation medicines used during the test).    FOLLOW UP: Our staff will call the number listed on your records Tuesday morning between 7:15 am and 8:15 am following your procedure to check on you and address any questions or concerns that you may have regarding the information given to you following your procedure. If we do not reach you, we will leave a message.  We will attempt to reach you two times.  During this call, we will ask if you have developed any symptoms of COVID 19. If you develop any symptoms (ie: fever, flu-like symptoms, shortness of breath, cough etc.) before then, please call [phone].  If you test positive for Covid 19 in the 2 weeks post procedure, please call and report this information to [place].    If any biopsies were taken you will be contacted by phone or by letter within the next 1-3 weeks.  Please call us at [phone] if you have not heard about the biopsies in 3 weeks.    SIGNATURES/CONFIDENTIALITY: You and/or your care partner have signed paperwork which will be entered into your electronic medical record.  These signatures attest to the fact that that the information above on your After Visit Summary has been reviewed  and is understood.  Full responsibility of the confidentiality of this discharge information lies with you and/or your care-partner.

## 2021-10-03 NOTE — Progress Notes (Signed)
Report given to PACU, vss 

## 2021-10-07 ENCOUNTER — Telehealth: Payer: Self-pay

## 2021-10-07 NOTE — Telephone Encounter (Signed)
°  Follow up Call-  Call back number 10/03/2021  Post procedure Call Back phone  # [phone]  Permission to leave phone message Yes  Some recent data might be hidden     Patient questions:  Do you have a fever, pain , or abdominal swelling? No. Pain Score  0 *  Have you tolerated food without any problems? Yes.    Have you been able to return to your normal activities? Yes.    Do you have any questions about your discharge instructions: Diet   No. Medications  No. Follow up visit  No.  Do you have questions or concerns about your Care? No.  Actions: * If pain score is 4 or above: No action needed, pain <4.

## 2021-10-14 NOTE — Telephone Encounter (Signed)
Entered in error

## 2021-12-29 ENCOUNTER — Other Ambulatory Visit: Payer: Self-pay | Admitting: Internal Medicine
